# Patient Record
Sex: Female | Born: 1985 | Race: Black or African American | Hispanic: No | Marital: Married | Smoking: Never smoker
Health system: Southern US, Community
[De-identification: ages and names within clinical notes are randomized; demographics above are authoritative.]

## PROBLEM LIST (undated history)

## (undated) DIAGNOSIS — A159 Respiratory tuberculosis unspecified: Secondary | ICD-10-CM

## (undated) HISTORY — PX: ANKLE FRACTURE SURGERY: SHX122

## (undated) HISTORY — PX: HIP ARTHROPLASTY: SHX981

---

## 2022-01-17 ENCOUNTER — Emergency Department (HOSPITAL_COMMUNITY): Payer: Self-pay

## 2022-01-17 ENCOUNTER — Encounter (HOSPITAL_COMMUNITY): Payer: Self-pay

## 2022-01-17 ENCOUNTER — Inpatient Hospital Stay (HOSPITAL_COMMUNITY)
Admission: EM | Admit: 2022-01-17 | Discharge: 2022-01-21 | DRG: 871 | Disposition: A | Discharge status: Home / Self Care | Payer: Self-pay | Attending: Internal Medicine | Admitting: Internal Medicine

## 2022-01-17 ENCOUNTER — Inpatient Hospital Stay (HOSPITAL_COMMUNITY): Payer: Self-pay

## 2022-01-17 ENCOUNTER — Other Ambulatory Visit: Payer: Self-pay

## 2022-01-17 DIAGNOSIS — E66811 Obesity, class 1: Secondary | ICD-10-CM | POA: Diagnosis present

## 2022-01-17 DIAGNOSIS — A419 Sepsis, unspecified organism: Principal | ICD-10-CM | POA: Diagnosis present

## 2022-01-17 DIAGNOSIS — Z6833 Body mass index (BMI) 33.0-33.9, adult: Secondary | ICD-10-CM

## 2022-01-17 DIAGNOSIS — Z881 Allergy status to other antibiotic agents status: Secondary | ICD-10-CM

## 2022-01-17 DIAGNOSIS — J984 Other disorders of lung: Secondary | ICD-10-CM

## 2022-01-17 DIAGNOSIS — R739 Hyperglycemia, unspecified: Secondary | ICD-10-CM | POA: Diagnosis present

## 2022-01-17 DIAGNOSIS — J189 Pneumonia, unspecified organism: Secondary | ICD-10-CM | POA: Diagnosis present

## 2022-01-17 DIAGNOSIS — Z20822 Contact with and (suspected) exposure to covid-19: Secondary | ICD-10-CM | POA: Diagnosis present

## 2022-01-17 DIAGNOSIS — J470 Bronchiectasis with acute lower respiratory infection: Secondary | ICD-10-CM | POA: Diagnosis present

## 2022-01-17 DIAGNOSIS — D71 Functional disorders of polymorphonuclear neutrophils: Secondary | ICD-10-CM | POA: Diagnosis present

## 2022-01-17 DIAGNOSIS — Z8611 Personal history of tuberculosis: Secondary | ICD-10-CM

## 2022-01-17 DIAGNOSIS — J479 Bronchiectasis, uncomplicated: Secondary | ICD-10-CM

## 2022-01-17 DIAGNOSIS — R7989 Other specified abnormal findings of blood chemistry: Secondary | ICD-10-CM | POA: Diagnosis present

## 2022-01-17 DIAGNOSIS — E669 Obesity, unspecified: Secondary | ICD-10-CM | POA: Diagnosis present

## 2022-01-17 HISTORY — DX: Respiratory tuberculosis unspecified: A15.9

## 2022-01-17 HISTORY — DX: Obesity, unspecified: E66.9

## 2022-01-17 HISTORY — DX: Obesity, class 1: E66.811

## 2022-01-17 LAB — COMPREHENSIVE METABOLIC PANEL
ALT: 45 U/L — ABNORMAL HIGH (ref 0–44)
AST: 38 U/L (ref 15–41)
Albumin: 4.1 g/dL (ref 3.5–5.0)
Alkaline Phosphatase: 79 U/L (ref 38–126)
Anion gap: 11 (ref 5–15)
BUN: 11 mg/dL (ref 6–20)
CO2: 21 mmol/L — ABNORMAL LOW (ref 22–32)
Calcium: 8.9 mg/dL (ref 8.9–10.3)
Chloride: 106 mmol/L (ref 98–111)
Creatinine, Ser: 0.96 mg/dL (ref 0.44–1.00)
GFR, Estimated: >60 mL/min (ref >60)
Glucose, Bld: 146 mg/dL — ABNORMAL HIGH (ref 70–99)
Potassium: 4 mmol/L (ref 3.5–5.1)
Sodium: 138 mmol/L (ref 135–145)
Total Bilirubin: 1.3 mg/dL — ABNORMAL HIGH (ref 0.3–1.2)
Total Protein: 7.3 g/dL (ref 6.5–8.1)

## 2022-01-17 LAB — CBC WITH DIFFERENTIAL/PLATELET
Abs Immature Granulocytes: 0.05 10*3/uL (ref 0.00–0.07)
Basophils Absolute: 0 10*3/uL (ref 0.0–0.1)
Basophils Relative: 0 %
Eosinophils Absolute: 0 10*3/uL (ref 0.0–0.5)
Eosinophils Relative: 0 %
HCT: 40.2 % (ref 36.0–46.0)
Hemoglobin: 13.5 g/dL (ref 12.0–15.0)
Immature Granulocytes: 1 %
Lymphocytes Relative: 11 %
Lymphs Abs: 1.2 10*3/uL (ref 0.7–4.0)
MCH: 30.1 pg (ref 26.0–34.0)
MCHC: 33.6 g/dL (ref 30.0–36.0)
MCV: 89.7 fL (ref 80.0–100.0)
Monocytes Absolute: 0.3 10*3/uL (ref 0.1–1.0)
Monocytes Relative: 3 %
Neutro Abs: 9.3 10*3/uL — ABNORMAL HIGH (ref 1.7–7.7)
Neutrophils Relative %: 85 %
Platelets: 258 10*3/uL (ref 150–400)
RBC: 4.48 MIL/uL (ref 3.87–5.11)
RDW: 12.4 % (ref 11.5–15.5)
WBC: 10.9 10*3/uL — ABNORMAL HIGH (ref 4.0–10.5)
nRBC: 0 % (ref 0.0–0.2)

## 2022-01-17 LAB — URINALYSIS, ROUTINE W REFLEX MICROSCOPIC
Bilirubin Urine: NEGATIVE
Glucose, UA: NEGATIVE mg/dL
Ketones, ur: NEGATIVE mg/dL
Nitrite: NEGATIVE
Protein, ur: NEGATIVE mg/dL
Specific Gravity, Urine: 1.005 (ref 1.005–1.030)
pH: 6 (ref 5.0–8.0)

## 2022-01-17 LAB — MRSA NEXT GEN BY PCR, NASAL: MRSA by PCR Next Gen: NOT DETECTED

## 2022-01-17 LAB — RESPIRATORY PANEL BY PCR

## 2022-01-17 LAB — LACTIC ACID, PLASMA
Lactic Acid, Venous: 2.1 mmol/L (ref 0.5–1.9)
Lactic Acid, Venous: 1.1 mmol/L (ref 0.5–1.9)

## 2022-01-17 LAB — MONONUCLEOSIS SCREEN: Mono Screen: NEGATIVE

## 2022-01-17 LAB — STREP PNEUMONIAE URINARY ANTIGEN: Strep Pneumo Urinary Antigen: NEGATIVE

## 2022-01-17 LAB — HEMOGLOBIN A1C
Hgb A1c MFr Bld: 4.5 % — ABNORMAL LOW (ref 4.8–5.6)
Mean Plasma Glucose: 82.45 mg/dL

## 2022-01-17 LAB — PROTIME-INR
INR: 1.4 — ABNORMAL HIGH (ref 0.8–1.2)
Prothrombin Time: 17.3 seconds — ABNORMAL HIGH (ref 11.4–15.2)

## 2022-01-17 LAB — RESP PANEL BY RT-PCR (FLU A&B, COVID) ARPGX2
Influenza A by PCR: NEGATIVE
Influenza B by PCR: NEGATIVE
SARS Coronavirus 2 by RT PCR: NEGATIVE

## 2022-01-17 LAB — HIV ANTIBODY (ROUTINE TESTING W REFLEX): HIV Screen 4th Generation wRfx: NONREACTIVE

## 2022-01-17 LAB — I-STAT BETA HCG BLOOD, ED (MC, WL, AP ONLY): I-stat hCG, quantitative: <5 m[IU]/mL (ref <5)

## 2022-01-17 LAB — APTT: aPTT: 33 seconds (ref 24–36)

## 2022-01-17 MED ORDER — IPRATROPIUM-ALBUTEROL 0.5-2.5 (3) MG/3ML IN SOLN: 3.0000 mL | RESPIRATORY_TRACT | Status: DC | PRN

## 2022-01-17 MED ORDER — LACTATED RINGERS IV BOLUS (SEPSIS)
1000.0000 mL | Freq: Once | INTRAVENOUS | Status: AC
  Administered 2022-01-17: 1000 mL via INTRAVENOUS

## 2022-01-17 MED ORDER — ALBUTEROL SULFATE HFA 108 (90 BASE) MCG/ACT IN AERS: 2.0000 | INHALATION_SPRAY | RESPIRATORY_TRACT | Status: DC | PRN

## 2022-01-17 MED ORDER — PROCHLORPERAZINE EDISYLATE 10 MG/2ML IJ SOLN
10.0000 mg | Freq: Once | INTRAMUSCULAR | Status: AC
  Administered 2022-01-17: 10 mg via INTRAVENOUS
  Filled 2022-01-17: qty 2

## 2022-01-17 MED ORDER — VANCOMYCIN HCL IN DEXTROSE 1-5 GM/200ML-% IV SOLN
1000.0000 mg | Freq: Once | INTRAVENOUS | Status: AC
  Administered 2022-01-17: 1000 mg via INTRAVENOUS
  Filled 2022-01-17: qty 200

## 2022-01-17 MED ORDER — LACTATED RINGERS IV SOLN: INTRAVENOUS | Status: AC

## 2022-01-17 MED ORDER — SODIUM CHLORIDE 0.9 % IV SOLN
1.0000 g | Freq: Three times a day (TID) | INTRAVENOUS | Status: DC
  Administered 2022-01-17: 1 g via INTRAVENOUS
  Filled 2022-01-17 (×2): qty 5

## 2022-01-17 MED ORDER — PROCHLORPERAZINE EDISYLATE 10 MG/2ML IJ SOLN
5.0000 mg | Freq: Once | INTRAMUSCULAR | Status: AC
  Administered 2022-01-17: 5 mg via INTRAVENOUS
  Filled 2022-01-17: qty 2

## 2022-01-17 MED ORDER — LACTATED RINGERS IV BOLUS (SEPSIS)
500.0000 mL | Freq: Once | INTRAVENOUS | Status: AC
  Administered 2022-01-17: 500 mL via INTRAVENOUS

## 2022-01-17 MED ORDER — KETOROLAC TROMETHAMINE 30 MG/ML IJ SOLN
30.0000 mg | Freq: Once | INTRAMUSCULAR | Status: AC
  Administered 2022-01-17: 30 mg via INTRAVENOUS
  Filled 2022-01-17: qty 1

## 2022-01-17 MED ORDER — ALBUTEROL SULFATE (2.5 MG/3ML) 0.083% IN NEBU
2.5000 mg | INHALATION_SOLUTION | RESPIRATORY_TRACT | Status: DC | PRN
Start: 2022-01-17 — End: 2022-01-21

## 2022-01-17 MED ORDER — METRONIDAZOLE 500 MG/100ML IV SOLN
500.0000 mg | Freq: Once | INTRAVENOUS | Status: AC
Start: 2022-01-17 — End: 2022-01-17
  Administered 2022-01-17: 500 mg via INTRAVENOUS
  Filled 2022-01-17: qty 100

## 2022-01-17 MED ORDER — PANTOPRAZOLE SODIUM 40 MG IV SOLR
40.0000 mg | Freq: Once | INTRAVENOUS | Status: AC
  Administered 2022-01-17: 40 mg via INTRAVENOUS
  Filled 2022-01-17: qty 10

## 2022-01-17 MED ORDER — CHLORHEXIDINE GLUCONATE CLOTH 2 % EX PADS
6.0000 | MEDICATED_PAD | Freq: Every day | CUTANEOUS | Status: DC
Start: 2022-01-18 — End: 2022-01-20
  Administered 2022-01-18 – 2022-01-20 (×3): 6 via TOPICAL

## 2022-01-17 MED ORDER — SODIUM CHLORIDE 0.9 % IV SOLN
2.0000 g | Freq: Once | INTRAVENOUS | Status: DC
  Filled 2022-01-17: qty 12.5

## 2022-01-17 MED ORDER — ACETAMINOPHEN 325 MG PO TABS
650.0000 mg | ORAL_TABLET | Freq: Four times a day (QID) | ORAL | Status: DC | PRN
  Administered 2022-01-17 – 2022-01-21 (×6): 650 mg via ORAL
  Filled 2022-01-17 (×6): qty 2

## 2022-01-17 MED ORDER — LEVOFLOXACIN IN D5W 750 MG/150ML IV SOLN
750.0000 mg | INTRAVENOUS | Status: AC
  Administered 2022-01-17 – 2022-01-21 (×5): 750 mg via INTRAVENOUS
  Filled 2022-01-17 (×5): qty 150

## 2022-01-17 MED ORDER — ACETAMINOPHEN 325 MG PO TABS
650.0000 mg | ORAL_TABLET | Freq: Once | ORAL | Status: AC
  Administered 2022-01-17: 650 mg via ORAL
  Filled 2022-01-17: qty 2

## 2022-01-17 MED ORDER — IBUPROFEN 800 MG PO TABS
800.0000 mg | ORAL_TABLET | Freq: Once | ORAL | Status: AC
  Administered 2022-01-17: 800 mg via ORAL
  Filled 2022-01-17: qty 1

## 2022-01-17 MED ORDER — ACETAMINOPHEN 650 MG RE SUPP: 650.0000 mg | Freq: Four times a day (QID) | RECTAL | Status: DC | PRN

## 2022-01-17 MED ORDER — ORAL CARE MOUTH RINSE
15.0000 mL | Freq: Two times a day (BID) | OROMUCOSAL | Status: DC
  Administered 2022-01-17 – 2022-01-20 (×6): 15 mL via OROMUCOSAL

## 2022-01-17 MED ORDER — METRONIDAZOLE 500 MG/100ML IV SOLN
500.0000 mg | Freq: Two times a day (BID) | INTRAVENOUS | Status: DC
  Administered 2022-01-17 – 2022-01-19 (×4): 500 mg via INTRAVENOUS
  Filled 2022-01-17 (×5): qty 100

## 2022-01-17 MED ORDER — ONDANSETRON HCL 4 MG/2ML IJ SOLN
4.0000 mg | Freq: Four times a day (QID) | INTRAMUSCULAR | Status: DC | PRN
  Administered 2022-01-17 (×2): 4 mg via INTRAVENOUS
  Filled 2022-01-17 (×3): qty 2

## 2022-01-17 NOTE — Sepsis Progress Note (Signed)
Following per sepsis protocol   

## 2022-01-17 NOTE — ED Notes (Signed)
Patient transported to CT 

## 2022-01-17 NOTE — ED Notes (Addendum)
Paged Hospitalist@06 :25am.

## 2022-01-17 NOTE — ED Notes (Signed)
US at bedside

## 2022-01-17 NOTE — Progress Notes (Signed)
A consult was received from an ED physician for vancomycin and cefepime per pharmacy dosing.  The patient's profile has been reviewed for ht/wt/allergies/indication/available labs.   A one time order has been placed for vanocmycin 1gm and cefepime 2gm.    Further antibiotics/pharmacy consults should be ordered by admitting physician if indicated.                       Thank you, Arley Phenix RPh 01/17/2022, 5:32 AM

## 2022-01-17 NOTE — ED Notes (Signed)
Patient's husband, Melanee Spry [phone]

## 2022-01-17 NOTE — H&P (Addendum)
History and Physical    Patient: Misty Heath GYI:[id number] DOB: Mar 08, 1986 DOA: 01/17/2022 DOS: the patient was seen and examined on 01/17/2022 PCP: Pcp, No  Patient coming from: Home  Chief Complaint:  Chief Complaint  Patient presents with   Fever   Headache   Generalized Body Aches   HPI: Misty Heath is a 36 y.o. female with medical history significant of class I obesity who is coming to the emergency department with complaints of 3 days of fever, frontal headache, generalized body aches, fatigue and generalized weakness for the past 3 days.  No sick contacts or travel history.  She has also had chills night sweats.  No sore throat, rhinorrhea, dyspnea, wheezing or hemoptysis.  No chest pain, palpitations, diaphoresis, PND, orthopnea or pitting edema of the lower extremities.  No appetite changes, abdominal pain, diarrhea, constipation, melena or hematochezia.  No flank pain, dysuria, frequency or hematuria.  No polyuria, polydipsia, polyphagia or blurred vision.   ED course: Initial vital signs were temperature 106.2, pulse 177, respirations 22, BP 111/77 mmHg and O2 sat 91% on room air.  The patient received 2500 mL of LR bolus, ondansetron 4 mg IVP, vancomycin, aztreonam and IV metronidazole.  Lab work: Her CBC is her white count 10.9 with 85% neutrophils, hemoglobin 13.5 g/dL and platelets 350.  PT was 17.3, INR 1.4 and PTT 33. Urinalysis with moderate hemoglobinuria, trace leukocyte esterase and many bacteria.  Coronavirus 2 and influenza PCR was negative.  CMP showed a CO2 of 21 mmol/L, glucose of 146 and total bilirubin 1.3 mg/dL.  ALT was 45 units/L.  Lactic acid was 2.1 and then 1.1 mmol/L.  Imaging: 2 view chest radiograph with left greater than right upper lobe widespread pulmonary nodularity, superimposed medial left upper lobe consolidation.  No pleural effusion.  Follow-up two-view chest radiograph recommended in 3 to 4 weeks after antibiotic therapy.  Associated left lung  volume loss with some leftward shift of the trachea and mediastinum, mediastinal contour seems to remain normal.  Review of Systems: As mentioned in the history of present illness. All other systems reviewed and are negative.  History reviewed. No pertinent past medical history. History reviewed. No pertinent surgical history. Social History:  reports that she has never smoked. She has never used smokeless tobacco. She reports that she does not drink alcohol and does not use drugs.  Allergies  Allergen Reactions   Ceclor [Cefaclor] Anaphylaxis and Shortness Of Breath    History reviewed.  To the best of her knowledge, there is no medical family history.  Prior to Admission medications   Not on File    Physical Exam: Vitals:   01/17/22 0700 01/17/22 0930 01/17/22 1000 01/17/22 1035  BP:  135/76 122/75   Pulse:  95 85   Resp:  16 19   Temp: (!) 100.8 F (38.2 C)   98.3 F (36.8 C)  TempSrc: Oral   Oral  SpO2:  98% 98%   Weight:      Height:       Physical Exam Vitals and nursing note reviewed.  Constitutional:      Appearance: She is obese. She is ill-appearing.  HENT:     Head: Normocephalic.     Mouth/Throat:     Mouth: Mucous membranes are moist.  Eyes:     General: No scleral icterus.    Pupils: Pupils are equal, round, and reactive to light.  Neck:     Vascular: No JVD.  Cardiovascular:  Rate and Rhythm: Normal rate and regular rhythm.     Heart sounds: Normal heart sounds, S1 normal and S2 normal.  Pulmonary:     Effort: Pulmonary effort is normal. No accessory muscle usage or respiratory distress.     Breath sounds: Examination of the right-upper field reveals rales. Examination of the left-upper field reveals rales. Rales present. No wheezing or rhonchi.  Abdominal:     General: Bowel sounds are normal. There is no distension.     Palpations: Abdomen is soft.     Tenderness: There is no abdominal tenderness.  Musculoskeletal:     Cervical back: Neck  supple.     Right lower leg: No edema.     Left lower leg: No edema.  Skin:    General: Skin is warm and dry.  Neurological:     General: No focal deficit present.     Mental Status: She is alert and oriented to person, place, and time.  Psychiatric:        Mood and Affect: Mood normal.        Behavior: Behavior normal.    Data Reviewed:  Results are pending, will review when available.  Principal Problem:   CAP (community acquired pneumonia) Meeting criteria for: Sepsis due to undetermined organism POA [facility]) With imaging revealing:   Bronchiectasis (HCC)   Pulmonary calcification determined by imaging Admit to stepdown/inpatient. Supplemental oxygen as needed. As needed bronchodilators. Begin Levaquin 750 mg IVPB daily. Continue metronidazole every 12 hours. Check strep pneumoniae urinary antigen. Check sputum Gram stain, culture and sensitivity. Follow-up blood culture and sensitivity. Follow-up CBC and chemistry in the morning. Pulmonary consult appreciated.  Active Problems:   Class 1 obesity BMI of 34.11 kg/m. Lifestyle modifications. Needs to establish with PCP.    Hyperglycemia Hemoglobin A1c was 4.5%. Lifestyle modifications advised.    Abnormal LFTs RUQ ultrasound was normal.     Advance Care Planning:   Code Status: Full Code    Consults:   Family Communication:   Severity of Illness: The appropriate patient status for this patient is INPATIENT. Inpatient status is judged to be reasonable and necessary in order to provide the required intensity of service to ensure the patient's safety. The patient's presenting symptoms, physical exam findings, and initial radiographic and laboratory data in the context of their chronic comorbidities is felt to place them at high risk for further clinical deterioration. Furthermore, it is not anticipated that the patient will be medically stable for discharge from the hospital within 2 midnights of admission.   *  I certify that at the point of admission it is my clinical judgment that the patient will require inpatient hospital care spanning beyond 2 midnights from the point of admission due to high intensity of service, high risk for further deterioration and high frequency of surveillance required.*  Author: Bobette Mo, MD 01/17/2022 10:43 AM  For on call review [email or web address].   This document was prepared using Dragon voice recognition system and may contain some unintended transcription errors.

## 2022-01-17 NOTE — ED Notes (Signed)
Pulmonology at bedside.

## 2022-01-17 NOTE — ED Triage Notes (Signed)
Pt reports with fever, headache, and generalized body aches x 3 days.

## 2022-01-17 NOTE — Progress Notes (Signed)
  Carryover admission to the Day Admitter.  I discussed this case with the EDP, Dr.Palumbo.  Per these discussions:   This is a 36 year old female who is being admitted for sepsis due to suspected community-acquired pneumonia presenting with 2 to 3 days of subjective fever, with reported temperature max of 10 5-1 06 at home over that timeframe.  Not associate with any headache, neck stiffness, rash, abdominal pain, nausea, vomiting, diarrhea, dysuria.  Objective fever noted in the ED this evening, and the patient has received both Tylenol and ibuprofen for this.  Initially tachycardic, with heart rates in the 160s to 170s, reportedly sinus, improving with IV fluids and initiation of broad-spectrum IV antibiotics, with most recent heart rates noted to be in the 110s.  Blood pressure most recently noted to be 124/76 following 2700 LR bolus.  O2 sats 97% on room air.  Chest x-ray, while reportedly suboptimal quality, reportedly appeared to demonstrate evidence of right upper lobe infiltrate concerning for pneumonia.  Two-view chest x-ray has been ordered to further evaluate, with result currently pending.  COVID-19/influenza PCR negative.  Blood cultures x2 were collected prior to initiation of IV vancomycin and aztreonam, prompted by reported allergies to cephalosporin class.  Stat lactate has been ordered, with result currently pending.  I have placed an order for inpatient admission for further evaluation management of sepsis due to suspected community-acquired pneumonia  I have placed some additional preliminary admit orders via the adult multi-morbid admission order set.  We will need orders for additional IV antibiotics.  Follow-up result of lactate.  Additionally, please follow-up on result of two-view chest x-ray, as above.   Newton Pigg, DO Hospitalist

## 2022-01-17 NOTE — Consult Note (Signed)
NAME:  Misty Heath, MRN:  [id number], DOB:  09-27-1985, LOS: 0 ADMISSION DATE:  01/17/2022, CONSULTATION DATE:  6/1 REFERRING MD:  Robb Matar, CHIEF COMPLAINT:  abnormal cxr    History of Present Illness:  36 year old female who presented to ER 6/1 w/ cc: fever, HA and generalized body ache w/ fatigue and weakness on-going for approx 3d.  In ER temp 106.2 HR 170s.  RA sats 91%, RVP negative for influenza and Covid.  CXR showed LUL airspace disease and CT imaging was recommended and obtained. On CT imaging showed LUL bronchiectasis w/ multiple calcified pulmonary nodules. Because of this imaging PCCM asked to evaluate.  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  Has chronic cough  Significant Hospital Events: Including procedures, antibiotic start and stop dates in addition to other pertinent events   6/1 admitted w/ fever, chills, HA, malaise, working dx PNA and sepsis. Lactate 2.1, RT-PCR neg, U antigens checked, IV fluid bolus administered. Started on IV flagyl and levaquin. PCCM asked to evaluate due to left upper lobe btx and cavitary lung nodules.   Interim History / Subjective:  Feels a little better.   Objective   Blood pressure 139/71, pulse 90, temperature 98.3 F (36.8 C), temperature source Oral, resp. rate 20, height 5\' 4"  (1.626 m), weight 90.1 kg, SpO2 96 %.        Intake/Output Summary (Last 24 hours) at 01/17/2022 1306 Last data filed at 01/17/2022 0732 Gross per 24 hour  Intake 2709.98 ml  Output no documentation  Net 2709.98 ml   Filed Weights   01/17/22 0516 01/17/22 0539  Weight: 78 kg 90.1 kg    Examination: General: 36 yof resting in bed NAD  HENT: NCAT MMM Lungs: clear  Cardiovascular: RRR w/out MRG Abdomen: soft  Extremities: warm and dry  Neuro: awake and oriented  GU: voids  Resolved Hospital Problem list     Assessment & Plan:   Fever and myalgia ? 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  Will decide on FOB Send full RVP Fungal studies if able Cont CAP coverage.   See attending note above   03/19/22 ACNP-BC [facility] Pulmonary/Critical Care Pager # [phone] OR # [phone] if no answer      Best Practice (right click and "Reselect all SmartList Selections" daily)  Per primary   Labs   CBC: Recent Labs  Lab 01/17/22 0524  WBC 10.9*  NEUTROABS 9.3*  HGB 13.5  HCT 40.2  MCV 89.7  PLT 258    Basic Metabolic Panel: Recent Labs  Lab 01/17/22 0524  NA 138  K 4.0  CL 106  CO2 21*  GLUCOSE 146*  BUN 11  CREATININE 0.96  CALCIUM 8.9   GFR: Estimated Creatinine Clearance: 89 mL/min (by C-G formula based on SCr of 0.96 mg/dL). Recent Labs  Lab 01/17/22 0524 01/17/22 0728  WBC 10.9*  --   LATICACIDVEN 2.1* 1.1    Liver Function Tests: Recent Labs  Lab 01/17/22 0524  AST 38  ALT 45*  ALKPHOS 79  BILITOT 1.3*  PROT 7.3  ALBUMIN 4.1   No results for input(s): LIPASE, AMYLASE in the last 168 hours. No results for input(s): AMMONIA in the last 168 hours.  ABG No results found for: PHART, PCO2ART,  PO2ART, HCO3, TCO2, ACIDBASEDEF, O2SAT   Coagulation Profile: Recent Labs  Lab 01/17/22 0524  INR 1.4*    Cardiac Enzymes: No results for input(s): CKTOTAL, CKMB, CKMBINDEX, TROPONINI in the last 168 hours.  HbA1C: Hgb A1c MFr Bld  Date/Time Value Ref Range Status  01/17/2022 05:25 AM 4.5 (L) 4.8 - 5.6 % Final    Comment:    (NOTE) Pre diabetes:          5.7%-6.4%  Diabetes:              >6.4%  Glycemic control for   <7.0% adults with diabetes     CBG: No results for input(s): GLUCAP in the last 168 hours.  Review of Systems:   Review of Systems  Constitutional:  Positive for fever and malaise/fatigue.  Eyes: Negative.   Respiratory:  Positive for cough.   Cardiovascular: Negative.    Gastrointestinal: Negative.   Genitourinary: Negative.   Musculoskeletal:  Positive for myalgias.  Skin: Negative.   Endo/Heme/Allergies: Negative.   Psychiatric/Behavioral: Negative.     Past Medical History:  She,  has a past medical history of Class 1 obesity (01/17/2022).   Surgical History:   Past Surgical History:  Procedure Laterality Date   ANKLE FRACTURE SURGERY Right    HIP ARTHROPLASTY Right      Social History:   reports that she has never smoked. She has never used smokeless tobacco. She reports that she does not drink alcohol and does not use drugs.   Family History:  Her family history is not on file.   Allergies Allergies  Allergen Reactions   Ceclor [Cefaclor] Anaphylaxis and Shortness Of Breath     Home Medications  Prior to Admission medications   Not on File     Critical care time: NA   Simonne Martinet ACNP-BC [facility] Pulmonary/Critical Care Pager # [phone] OR # [phone] if no answer

## 2022-01-17 NOTE — ED Provider Notes (Signed)
[facility]-EMERGENCY DEPT Provider Note   CSN: [id number] Arrival date & time: 01/17/22  0510     History  Chief Complaint  Patient presents with   Fever   Headache   Generalized Body Aches    Misty Heath is a 36 y.o. female.  The history is provided by the patient and the spouse. The history is limited by the condition of the patient.  Fever Max temp prior to arrival:  101.1 Temp source:  Oral Severity:  Moderate Onset quality:  Gradual Duration:  3 days Timing:  Constant Progression:  Waxing and waning Chronicity:  New Relieved by:  Nothing Worsened by:  Nothing Ineffective treatments:  Acetaminophen and ibuprofen Associated symptoms: chills   Associated symptoms: no cough, no diarrhea, no dysuria, no somnolence and no vomiting   Risk factors comment:  Started after going to the gym No tick exposure.  No rashes on the skin.      Home Medications Prior to Admission medications   Not on File      Allergies    Ceclor [cefaclor]    Review of Systems   Review of Systems  Unable to perform ROS: Acuity of condition  Constitutional:  Positive for chills and fever.  HENT:  Negative for facial swelling.   Eyes:  Negative for redness.  Respiratory:  Negative for cough.   Gastrointestinal:  Negative for diarrhea and vomiting.  Genitourinary:  Negative for dysuria.   Physical Exam Updated Vital Signs BP 111/77 (BP Location: Left Arm)   Pulse (!) 177   Temp (!) 105.6 F (40.9 C) (Rectal)   Resp (!) 22   Ht 5\' 4"  (1.626 m)   Wt 90.1 kg   LMP  (LMP Unknown)   SpO2 91%   BMI 34.11 kg/m  Physical Exam Vitals and nursing note reviewed.  Constitutional:      Appearance: She is well-developed. She is not diaphoretic.  HENT:     Head: Normocephalic and atraumatic.     Nose: Nose normal.     Mouth/Throat:     Mouth: Mucous membranes are moist.     Pharynx: Oropharynx is clear.  Eyes:     Conjunctiva/sclera: Conjunctivae normal.      Pupils: Pupils are equal, round, and reactive to light.     Comments: Normal appearance  Cardiovascular:     Rate and Rhythm: Regular rhythm. Tachycardia present.     Pulses: Normal pulses.     Heart sounds: Normal heart sounds.  Pulmonary:     Effort: Pulmonary effort is normal. No respiratory distress.     Breath sounds: Normal breath sounds.  Abdominal:     General: Bowel sounds are normal. There is no distension.     Palpations: Abdomen is soft. There is no mass.     Tenderness: There is no abdominal tenderness. There is no guarding or rebound.  Genitourinary:    Comments: No CVA tenderness Musculoskeletal:        General: Normal range of motion.     Cervical back: Normal range of motion and neck supple. No rigidity or tenderness.  Lymphadenopathy:     Cervical: No cervical adenopathy.  Skin:    General: Skin is warm and dry.     Capillary Refill: Capillary refill takes less than 2 seconds.     Findings: No rash.  Neurological:     General: No focal deficit present.     Mental Status: She is alert and oriented to person, place,  and time.  Psychiatric:        Mood and Affect: Mood normal.        Behavior: Behavior normal.    ED Results / Procedures / Treatments   Labs (all labs ordered are listed, but only abnormal results are displayed) Results for orders placed or performed during the hospital encounter of 01/17/22  Comprehensive metabolic panel  Result Value Ref Range   Sodium 138 135 - 145 mmol/L   Potassium 4.0 3.5 - 5.1 mmol/L   Chloride 106 98 - 111 mmol/L   CO2 21 (L) 22 - 32 mmol/L   Glucose, Bld 146 (H) 70 - 99 mg/dL   BUN 11 6 - 20 mg/dL   Creatinine, Ser 4.090.96 0.44 - 1.00 mg/dL   Calcium 8.9 8.9 - 81.110.3 mg/dL   Total Protein 7.3 6.5 - 8.1 g/dL   Albumin 4.1 3.5 - 5.0 g/dL   AST 38 15 - 41 U/L   ALT 45 (H) 0 - 44 U/L   Alkaline Phosphatase 79 38 - 126 U/L   Total Bilirubin 1.3 (H) 0.3 - 1.2 mg/dL   GFR, Estimated >91>60 >47>60 mL/min   Anion gap 11 5 - 15   Protime-INR  Result Value Ref Range   Prothrombin Time 17.3 (H) 11.4 - 15.2 seconds   INR 1.4 (H) 0.8 - 1.2  APTT  Result Value Ref Range   aPTT 33 24 - 36 seconds  CBC with Differential  Result Value Ref Range   WBC 10.9 (H) 4.0 - 10.5 K/uL   RBC 4.48 3.87 - 5.11 MIL/uL   Hemoglobin 13.5 12.0 - 15.0 g/dL   HCT 82.940.2 56.236.0 - 13.046.0 %   MCV 89.7 80.0 - 100.0 fL   MCH 30.1 26.0 - 34.0 pg   MCHC 33.6 30.0 - 36.0 g/dL   RDW 86.512.4 78.411.5 - 69.615.5 %   Platelets 258 150 - 400 K/uL   nRBC 0.0 0.0 - 0.2 %   Neutrophils Relative % 85 %   Neutro Abs 9.3 (H) 1.7 - 7.7 K/uL   Lymphocytes Relative 11 %   Lymphs Abs 1.2 0.7 - 4.0 K/uL   Monocytes Relative 3 %   Monocytes Absolute 0.3 0.1 - 1.0 K/uL   Eosinophils Relative 0 %   Eosinophils Absolute 0.0 0.0 - 0.5 K/uL   Basophils Relative 0 %   Basophils Absolute 0.0 0.0 - 0.1 K/uL   Immature Granulocytes 1 %   Abs Immature Granulocytes 0.05 0.00 - 0.07 K/uL  I-Stat beta hCG blood, ED  Result Value Ref Range   I-stat hCG, quantitative <5.0 <5 mIU/mL   Comment 3           No results found.   EKG EKG Interpretation  Date/Time:  Thursday January 17 2022 05:28:36 EDT Ventricular Rate:  174 PR Interval:  101 QRS Duration: 174 QT Interval:  326 QTC Calculation: 555 R Axis:   252 Text Interpretation: Right and left arm electrode reversal, interpretation assumes no reversal Sinus tachycardia Confirmed by Nicanor AlconPalumbo, Pricilla Moehle ([phone]) on 01/17/2022 5:40:47 AM  Radiology No results found.  Procedures Procedures    Medications Ordered in ED Medications  lactated ringers infusion (has no administration in time range)  lactated ringers bolus 1,000 mL (1,000 mLs Intravenous New Bag/Given 01/17/22 0537)    And  lactated ringers bolus 1,000 mL (1,000 mLs Intravenous New Bag/Given 01/17/22 0538)    And  lactated ringers bolus 500 mL (has no administration in time range)  metroNIDAZOLE (  FLAGYL) IVPB 500 mg (has no administration in time range)  vancomycin  (VANCOCIN) IVPB 1000 mg/200 mL premix (has no administration in time range)  aztreonam (AZACTAM) 1 g in sodium chloride 0.9 % 100 mL IVPB (has no administration in time range)  acetaminophen (TYLENOL) tablet 650 mg (650 mg Oral Given 01/17/22 0542)  ibuprofen (ADVIL) tablet 800 mg (800 mg Oral Given 01/17/22 0542)     ED Course/ Medical Decision Making/ A&P                           Medical Decision Making Fever following going to the gym the other day.  Body aches and chills.    Amount and/or Complexity of Data Reviewed Independent Historian: spouse    Details: see above External Data Reviewed: notes.    Details: previous notes reivewed. Labs: ordered.    Details: all labs reviewed: normal sodium 138, normal potassium 4.0, CO2 low 21, BUN normal creatinine .96.  slight elevation of bilirubin 1.3 slight elevation of ALT.  white count elevated 10.9 and normal hemoglobin 13.5.  Elevated PT and INR. Radiology: ordered and independent interpretation performed.    Details: NO CHF by me on CXR ECG/medicine tests: ordered and independent interpretation performed. Decision-making details documented in ED Course. Discussion of management or test interpretation with external provider(s): Case d/w Dr. Arlean Hopping of hospitalist   Risk OTC drugs. Prescription drug management. Decision regarding hospitalization. Risk Details: Sepsis of unknown etiology.  Tachycardia out of proportion to fever.    Critical Care Total time providing critical care: 60 minutes (Sepsis bundle with multiple boluses and multiple antibiotics given.  )   Final Clinical Impression(s) / ED Diagnoses Final diagnoses:  Sepsis, due to unspecified organism, unspecified whether acute organ dysfunction present [facility])   The patient appears reasonably stabilized for admission considering the current resources, flow, and capabilities available in the ED at this time, and I doubt any other [facility] requiring further screening and/or  treatment in the ED prior to admission.  Rx / DC Orders ED Discharge Orders     None         Zaydon Kinser, MD 01/17/22 3710

## 2022-01-18 ENCOUNTER — Encounter (HOSPITAL_COMMUNITY)
Admission: EM | Disposition: A | Discharge status: Home / Self Care | Payer: Self-pay | Source: Home / Self Care | Attending: Internal Medicine

## 2022-01-18 ENCOUNTER — Telehealth: Payer: Self-pay | Admitting: Student

## 2022-01-18 ENCOUNTER — Encounter (HOSPITAL_COMMUNITY): Payer: Self-pay

## 2022-01-18 HISTORY — PX: VIDEO BRONCHOSCOPY: SHX5072

## 2022-01-18 HISTORY — PX: BRONCHIAL WASHINGS: SHX5105

## 2022-01-18 LAB — BODY FLUID CELL COUNT WITH DIFFERENTIAL
Eos, Fluid: 1 %
Lymphs, Fluid: 20 %
Monocyte-Macrophage-Serous Fluid: 13 % — ABNORMAL LOW (ref 50–90)
Neutrophil Count, Fluid: 66 % — ABNORMAL HIGH (ref 0–25)
Total Nucleated Cell Count, Fluid: 19 cu mm (ref 0–1000)

## 2022-01-18 LAB — CBC
HCT: 36.9 % (ref 36.0–46.0)
Hemoglobin: 12.3 g/dL (ref 12.0–15.0)
MCH: 29.5 pg (ref 26.0–34.0)
MCHC: 33.3 g/dL (ref 30.0–36.0)
MCV: 88.5 fL (ref 80.0–100.0)
Platelets: 220 10*3/uL (ref 150–400)
RBC: 4.17 MIL/uL (ref 3.87–5.11)
RDW: 12.6 % (ref 11.5–15.5)
WBC: 9.2 10*3/uL (ref 4.0–10.5)
nRBC: 0 % (ref 0.0–0.2)

## 2022-01-18 LAB — BASIC METABOLIC PANEL
Anion gap: 7 (ref 5–15)
BUN: 6 mg/dL (ref 6–20)
CO2: 22 mmol/L (ref 22–32)
Calcium: 8.8 mg/dL — ABNORMAL LOW (ref 8.9–10.3)
Chloride: 111 mmol/L (ref 98–111)
Creatinine, Ser: 0.65 mg/dL (ref 0.44–1.00)
GFR, Estimated: >60 mL/min (ref >60)
Glucose, Bld: 98 mg/dL (ref 70–99)
Potassium: 3.7 mmol/L (ref 3.5–5.1)
Sodium: 140 mmol/L (ref 135–145)

## 2022-01-18 SURGERY — VIDEO BRONCHOSCOPY WITHOUT FLUORO
Anesthesia: Moderate Sedation

## 2022-01-18 MED ORDER — PHENYLEPHRINE HCL 0.25 % NA SOLN
NASAL | Status: AC
  Filled 2022-01-18: qty 15

## 2022-01-18 MED ORDER — IBUPROFEN 200 MG PO TABS
400.0000 mg | ORAL_TABLET | Freq: Once | ORAL | Status: AC
  Administered 2022-01-18: 400 mg via ORAL
  Filled 2022-01-18: qty 2

## 2022-01-18 MED ORDER — LIDOCAINE HCL URETHRAL/MUCOSAL 2 % EX GEL
1.0000 "application " | Freq: Once | CUTANEOUS | Status: AC
  Filled 2022-01-18: qty 5

## 2022-01-18 MED ORDER — FENTANYL CITRATE (PF) 100 MCG/2ML IJ SOLN
INTRAMUSCULAR | Status: AC
  Filled 2022-01-18: qty 4

## 2022-01-18 MED ORDER — LIDOCAINE HCL URETHRAL/MUCOSAL 2 % EX GEL
CUTANEOUS | Status: AC
  Filled 2022-01-18: qty 30

## 2022-01-18 MED ORDER — MIDAZOLAM HCL 2 MG/2ML IJ SOLN
INTRAMUSCULAR | Status: AC
  Filled 2022-01-18: qty 8

## 2022-01-18 MED ORDER — FENTANYL CITRATE (PF) 100 MCG/2ML IJ SOLN: 50.0000 ug | INTRAMUSCULAR | Status: DC | PRN

## 2022-01-18 MED ORDER — SODIUM CHLORIDE 0.9 % IV SOLN: INTRAVENOUS | Status: DC | PRN

## 2022-01-18 MED ORDER — FENTANYL CITRATE (PF) 100 MCG/2ML IJ SOLN
INTRAMUSCULAR | Status: AC | PRN
  Administered 2022-01-18: 100 ug via INTRAVENOUS
  Administered 2022-01-18: 50 ug via INTRAVENOUS

## 2022-01-18 MED ORDER — LIDOCAINE HCL URETHRAL/MUCOSAL 2 % EX GEL
CUTANEOUS | Status: DC | PRN
  Administered 2022-01-18: 1

## 2022-01-18 MED ORDER — PHENYLEPHRINE 80 MCG/ML (10ML) SYRINGE FOR IV PUSH (FOR BLOOD PRESSURE SUPPORT)
PREFILLED_SYRINGE | INTRAVENOUS | Status: AC
  Filled 2022-01-18: qty 10

## 2022-01-18 MED ORDER — MIDAZOLAM HCL 2 MG/2ML IJ SOLN
INTRAMUSCULAR | Status: AC | PRN
  Administered 2022-01-18 (×4): 1 mg via INTRAVENOUS

## 2022-01-18 MED ORDER — LIDOCAINE HCL (PF) 4 % IJ SOLN
INTRAMUSCULAR | Status: AC
  Filled 2022-01-18: qty 5

## 2022-01-18 MED ORDER — MIDAZOLAM HCL 2 MG/2ML IJ SOLN: 2.0000 mg | INTRAMUSCULAR | Status: DC | PRN

## 2022-01-18 MED ORDER — LIDOCAINE HCL 2 % IJ SOLN
20.0000 mL | Freq: Once | INTRAMUSCULAR | Status: AC
Start: 2022-01-18 — End: 2022-01-18

## 2022-01-18 MED ORDER — LIDOCAINE HCL (PF) 4 % IJ SOLN
INTRAMUSCULAR | Status: DC | PRN
  Administered 2022-01-18: 4 mL via RESPIRATORY_TRACT

## 2022-01-18 MED ORDER — LIDOCAINE HCL 1 % IJ SOLN
INTRAMUSCULAR | Status: DC | PRN
  Administered 2022-01-18: 10 mL

## 2022-01-18 MED ORDER — MIDAZOLAM HCL (PF) 5 MG/ML IJ SOLN
INTRAMUSCULAR | Status: AC
  Filled 2022-01-18: qty 2

## 2022-01-18 MED ORDER — LIDOCAINE HCL 1 % IJ SOLN
INTRAMUSCULAR | Status: AC
  Filled 2022-01-18: qty 20

## 2022-01-18 MED ORDER — OXYMETAZOLINE HCL 0.05 % NA SOLN
NASAL | Status: DC | PRN
  Administered 2022-01-18: 2

## 2022-01-18 NOTE — Progress Notes (Signed)
[facility] PROGRESS NOTE    Progress Note  Misty Heath  YSA:[id number] DOB: 1985/08/23 DOA: 01/17/2022 PCP: Pcp, No     Brief Narrative:   Misty Heath is an 36 y.o. female past medical history of class I obesity comes into the emergency room for 3 days of fever headache generalized body aches was found to have a fever 102 pulse of 177 respiration of 22 blood pressure 117/70 satting satting 91% on room air she was fluid resuscitated in the ED, white count of 11 chest radiograph showed a left greater than right right upper lobe wedge and a left upper lobe pneumonia pulmonary nodularity    Assessment/Plan:   Multifocal pneumonia bronchiectasis calcified nodules: She is now being weaned to room air she continues to spike fevers, 103. Was started on IV Levaquin and Flagyl. Strep pneumo pending. Mono is negative respiratory panel is negative. HIV is nonreactive Airborne isolation. Sputum culture and blood cultures are negative till date. CT of the chest was done that showed multiple pulmonary nodules of different size with central calcification with a splenomegaly concern about granulomatous process, question sarcoid Pulmonary was consulted recommended isolation and bronchoscopy on 01/18/2022. N.p.o.   Class I obesity: Currently n.p.o.  Hyperglycemia: With an A1c of 4.5 lifestyle and diet modifications.  Mildly elevated LFTs: Alkaline phos and bilirubin within normal follow-up with PCP as an outpatient question fatty liver   DVT prophylaxis: lovenox Family Communication:Husband Status is: Inpatient Remains inpatient appropriate because: Multifocal pneumonia with newly granulomatous disease    Code Status:     Code Status Orders  (From admission, onward)           Start     Ordered   01/17/22 0636  Full code  Continuous        01/17/22 0636           Code Status History     This patient has a current code status but no historical code status.       Advance Directive Documentation    Flowsheet Row Most Recent Value  Type of Advance Directive Healthcare Power of Attorney  Pre-existing out of facility DNR order (yellow form or pink MOST form) --  "MOST" Form in Place? --         IV Access:   Peripheral IV   Procedures and diagnostic studies:   DG Chest 2 View  Result Date: 01/17/2022 CLINICAL DATA:  37 year old female with fever headache and body ache. Possible sepsis. EXAM: CHEST - 2 VIEW COMPARISON:  Portable chest 0600 hours today. FINDINGS: PA and lateral views at 0657 hours. Some volume loss in the left hemithorax with superimposed confluent medial left upper lobe opacity and extensive surrounding peripheral upper lobe nodular opacity. Furthermore, there is contralateral right upper lobe lung nodularity, with multiple subcentimeter nodules apparent. No superimposed pneumothorax or pleural effusion. Mediastinal contours seem to remain normal. There is some leftward shift of the trachea. No acute osseous abnormality identified. Negative visible bowel gas. Cholecystectomy clips. IMPRESSION: 1. Constellation of left greater than right upper lobe widespread pulmonary nodularity, superimposed medial left upper lobe consolidation. No pleural effusion. In this age group and given fever, a disseminated bilateral lung infection is most likely. Followup PA and lateral chest X-ray is recommended in 3-4 weeks following trial of antibiotic therapy to ensure resolution and exclude underlying malignancy. 2. Associated left lung volume loss with some leftward shift of the trachea and mediastinum, but mediastinal contours seem to remain normal. Electronically Signed  By: Odessa Fleming M.D.   On: 01/17/2022 07:35   CT CHEST WO CONTRAST  Result Date: 01/17/2022 CLINICAL DATA:  Pneumonia, complication suspected EXAM: CT CHEST WITHOUT CONTRAST TECHNIQUE: Multidetector CT imaging of the chest was performed following the standard protocol without IV contrast.  RADIATION DOSE REDUCTION: This exam was performed according to the departmental dose-optimization program which includes automated exposure control, adjustment of the mA and/or kV according to patient size and/or use of iterative reconstruction technique. COMPARISON:  Chest radiograph done earlier today FINDINGS: Cardiovascular: Unremarkable. Mediastinum/Nodes: No significant lymphadenopathy seen. Lungs/Pleura: There are numerous nodular densities of varying sizes in the left upper lobe and left lower lobe. There are few small nodular densities in the right upper lobe. Most of these nodules show central calcification. There is ectasia of bronchi in the left upper lobe. There is decreased volume in the left lung in comparison to the right side. There is no pleural effusion or pneumothorax. Upper Abdomen: Spleen measures 15.1 cm in AP diameter. Surgical clips are seen in the gallbladder fossa. Small hiatal hernia is seen. Musculoskeletal: Unremarkable. IMPRESSION: There are numerous nodules of varying sizes in the left upper lobe, left lower lobe and right upper lobe. Most of these nodules show central calcification. Left lung is smaller than right suggesting scarring. Findings suggest chronic inflammation. Follow-up CT chest in 3 months may be considered to assess stability. There is no focal pulmonary consolidation. There is no pleural effusion. There is ectasia of bronchi in the left upper lobe. Enlarged spleen.  Small hiatal hernia. Electronically Signed   By: Ernie Avena M.D.   On: 01/17/2022 12:14   DG Chest Port 1 View  Result Date: 01/17/2022 CLINICAL DATA:  36 year old female with history of fever and body aches for the past 3 days. Possible sepsis. EXAM: PORTABLE CHEST 1 VIEW COMPARISON:  No priors. FINDINGS: Suboptimal highly rotated and under penetrated portable examination submitted for evaluation. Patchy areas of interstitial prominence an ill-defined opacities throughout the left mid to upper  lung. Right lung is clear. No definite pleural effusions. No pneumothorax. No evidence of pulmonary edema. Heart size is borderline enlarged. The patient is rotated to the left on today's exam, resulting in distortion of the mediastinal contours and reduced diagnostic sensitivity and specificity for mediastinal pathology. IMPRESSION: 1. Poor quality study demonstrating potential bronchopneumonia in the left upper lobe. Correlation with standing PA and lateral chest radiograph is recommended to better evaluate these findings. Electronically Signed   By: Trudie Reed M.D.   On: 01/17/2022 06:16   US Abdomen Limited RUQ (LIVER/GB)  Result Date: 01/17/2022 CLINICAL DATA:  Abnormal LFTs.  Cholecystectomy. EXAM: ULTRASOUND ABDOMEN LIMITED RIGHT UPPER QUADRANT COMPARISON:  None Available. FINDINGS: Gallbladder: Status post cholecystectomy. Common bile duct: Diameter: 3 mm Liver: No focal lesion identified. Within normal limits in parenchymal echogenicity. Portal vein is patent on color Doppler imaging with normal direction of blood flow towards the liver. Other: None. IMPRESSION: 1. Normal right upper quadrant sonogram. Liver is unremarkable without evidence of focal lesion or biliary ductal dilatation. 2.  Status post cholecystectomy. Electronically Signed   By: Larose Hires D.O.   On: 01/17/2022 11:30     Medical Consultants:   None.   Subjective:    Desarie Feild relates she feels about the same this morning no further fevers.  Objective:    Vitals:   01/18/22 0300 01/18/22 0400 01/18/22 0500 01/18/22 0511  BP:  (!) 151/103    Pulse: (!) 119 (!) 129  Resp: (!) 27 (!) 30    Temp:  (!) 103.2 F (39.6 C)  100.2 F (37.9 C)  TempSrc:  Oral  Oral  SpO2: 98% 98%    Weight:   92.8 kg   Height:       SpO2: 98 %   Intake/Output Summary (Last 24 hours) at 01/18/2022 0707 Last data filed at 01/18/2022 0400 Gross per 24 hour  Intake 5313.22 ml  Output --  Net 5313.22 ml   Filed Weights    01/17/22 0516 01/17/22 0539 01/18/22 0500  Weight: 78 kg 90.1 kg 92.8 kg    Exam: General exam: In no acute distress. Respiratory system: Good air movement and clear to auscultation. Cardiovascular system: S1 & S2 heard, RRR. No JVD. Gastrointestinal system: Abdomen is nondistended, soft and nontender.  Extremities: No pedal edema. Skin: No rashes, lesions or ulcers Psychiatry: Judgement and insight appear normal. Mood & affect appropriate.    Data Reviewed:    Labs: Basic Metabolic Panel: Recent Labs  Lab 01/17/22 0524 01/18/22 0313  NA 138 140  K 4.0 3.7  CL 106 111  CO2 21* 22  GLUCOSE 146* 98  BUN 11 6  CREATININE 0.96 0.65  CALCIUM 8.9 8.8*   GFR Estimated Creatinine Clearance: 108.3 mL/min (by C-G formula based on SCr of 0.65 mg/dL). Liver Function Tests: Recent Labs  Lab 01/17/22 0524  AST 38  ALT 45*  ALKPHOS 79  BILITOT 1.3*  PROT 7.3  ALBUMIN 4.1   No results for input(s): LIPASE, AMYLASE in the last 168 hours. No results for input(s): AMMONIA in the last 168 hours. Coagulation profile Recent Labs  Lab 01/17/22 0524  INR 1.4*   COVID-19 Labs  No results for input(s): DDIMER, FERRITIN, LDH, CRP in the last 72 hours.  Lab Results  Component Value Date   SARSCOV2NAA NEGATIVE 01/17/2022    CBC: Recent Labs  Lab 01/17/22 0524 01/18/22 0313  WBC 10.9* 9.2  NEUTROABS 9.3*  --   HGB 13.5 12.3  HCT 40.2 36.9  MCV 89.7 88.5  PLT 258 220   Cardiac Enzymes: No results for input(s): CKTOTAL, CKMB, CKMBINDEX, TROPONINI in the last 168 hours. BNP (last 3 results) No results for input(s): PROBNP in the last 8760 hours. CBG: No results for input(s): GLUCAP in the last 168 hours. D-Dimer: No results for input(s): DDIMER in the last 72 hours. Hgb A1c: Recent Labs    01/17/22 0525  HGBA1C 4.5*   Lipid Profile: No results for input(s): CHOL, HDL, LDLCALC, TRIG, CHOLHDL, LDLDIRECT in the last 72 hours. Thyroid function studies: No results  for input(s): TSH, T4TOTAL, T3FREE, THYROIDAB in the last 72 hours.  Invalid input(s): FREET3 Anemia work up: No results for input(s): VITAMINB12, FOLATE, FERRITIN, TIBC, IRON, RETICCTPCT in the last 72 hours. Sepsis Labs: Recent Labs  Lab 01/17/22 0524 01/17/22 0728 01/18/22 0313  WBC 10.9*  --  9.2  LATICACIDVEN 2.1* 1.1  --    Microbiology Recent Results (from the past 240 hour(s))  Resp Panel by RT-PCR (Flu A&B, Covid)     Status: None   Collection Time: 01/17/22  5:27 AM   Specimen: Nasal Swab  Result Value Ref Range Status   SARS Coronavirus 2 by RT PCR NEGATIVE NEGATIVE Final    Comment: (NOTE) SARS-CoV-2 target nucleic acids are NOT DETECTED.  The SARS-CoV-2 RNA is generally detectable in upper respiratory specimens during the acute phase of infection. The lowest concentration of SARS-CoV-2 viral copies this assay can detect  is 138 copies/mL. A negative result does not preclude SARS-Cov-2 infection and should not be used as the sole basis for treatment or other patient management decisions. A negative result may occur with  improper specimen collection/handling, submission of specimen other than nasopharyngeal swab, presence of viral mutation(s) within the areas targeted by this assay, and inadequate number of viral copies(<138 copies/mL). A negative result must be combined with clinical observations, patient history, and epidemiological information. The expected result is Negative.  Fact Sheet for Patients:  BloggerCourse.com  Fact Sheet for Healthcare Providers:  SeriousBroker.it  This test is no t yet approved or cleared by the [place] FDA and  has been authorized for detection and/or diagnosis of SARS-CoV-2 by FDA under an Emergency Use Authorization (EUA). This EUA will remain  in effect (meaning this test can be used) for the duration of the COVID-19 declaration under Section 564(b)(1) of the Act,  21 U.S.C.section 360bbb-3(b)(1), unless the authorization is terminated  or revoked sooner.       Influenza A by PCR NEGATIVE NEGATIVE Final   Influenza B by PCR NEGATIVE NEGATIVE Final    Comment: (NOTE) The Xpert Xpress SARS-CoV-2/FLU/RSV plus assay is intended as an aid in the diagnosis of influenza from Nasopharyngeal swab specimens and should not be used as a sole basis for treatment. Nasal washings and aspirates are unacceptable for Xpert Xpress SARS-CoV-2/FLU/RSV testing.  Fact Sheet for Patients: BloggerCourse.com  Fact Sheet for Healthcare Providers: SeriousBroker.it  This test is not yet approved or cleared by the [place] FDA and has been authorized for detection and/or diagnosis of SARS-CoV-2 by FDA under an Emergency Use Authorization (EUA). This EUA will remain in effect (meaning this test can be used) for the duration of the COVID-19 declaration under Section 564(b)(1) of the Act, 21 U.S.C. section 360bbb-3(b)(1), unless the authorization is terminated or revoked.  Performed at [facility], [address], [place], [place] [address]   MRSA Next Gen by PCR, Nasal     Status: None   Collection Time: 01/17/22  5:30 PM   Specimen: Nasal Mucosa; Nasal Swab  Result Value Ref Range Status   MRSA by PCR Next Gen NOT DETECTED NOT DETECTED Final    Comment: (NOTE) The GeneXpert MRSA Assay (FDA approved for NASAL specimens only), is one component of a comprehensive MRSA colonization surveillance program. It is not intended to diagnose MRSA infection nor to guide or monitor treatment for MRSA infections. Test performance is not FDA approved in patients less than 41 years old. Performed at [facility], [address], [place], [place] [address]   Respiratory (~20 pathogens) panel by PCR     Status: None   Collection Time: 01/17/22  6:58 PM   Specimen: Nasopharyngeal Swab;  Respiratory  Result Value Ref Range Status   Adenovirus NOT DETECTED NOT DETECTED Final   Coronavirus 229E NOT DETECTED NOT DETECTED Final    Comment: (NOTE) The Coronavirus on the Respiratory Panel, DOES NOT test for the novel  Coronavirus (2019 nCoV)    Coronavirus HKU1 NOT DETECTED NOT DETECTED Final   Coronavirus NL63 NOT DETECTED NOT DETECTED Final   Coronavirus OC43 NOT DETECTED NOT DETECTED Final   Metapneumovirus NOT DETECTED NOT DETECTED Final   Rhinovirus / Enterovirus NOT DETECTED NOT DETECTED Final   Influenza A NOT DETECTED NOT DETECTED Final   Influenza B NOT DETECTED NOT DETECTED Final   Parainfluenza Virus 1 NOT DETECTED NOT DETECTED Final   Parainfluenza Virus 2  NOT DETECTED NOT DETECTED Final   Parainfluenza Virus 3 NOT DETECTED NOT DETECTED Final   Parainfluenza Virus 4 NOT DETECTED NOT DETECTED Final   Respiratory Syncytial Virus NOT DETECTED NOT DETECTED Final   Bordetella pertussis NOT DETECTED NOT DETECTED Final   Bordetella Parapertussis NOT DETECTED NOT DETECTED Final   Chlamydophila pneumoniae NOT DETECTED NOT DETECTED Final   Mycoplasma pneumoniae NOT DETECTED NOT DETECTED Final    Comment: Performed at [facility], [address] [place], [place] [address]     Medications:    Chlorhexidine Gluconate Cloth  6 each Topical Q0600   mouth rinse  15 mL Mouth Rinse BID   Continuous Infusions:  levofloxacin (LEVAQUIN) IV Stopped (01/17/22 1249)   metronidazole Stopped (01/18/22 0612)      LOS: 1 day   Marinda ElkAbraham Feliz Ortiz  [facility]  01/18/2022, 7:07 AM

## 2022-01-18 NOTE — Telephone Encounter (Signed)
Patient scheduled for HFU on 03/01/2022 at 11:30am with Dr. Thora Lance. Appointment reminder mailed to patient. Nothing further needed.

## 2022-01-18 NOTE — Consult Note (Signed)
   NAME:  Misty Heath, MRN:  [id number], DOB:  18-Jan-1986, LOS: 1 ADMISSION DATE:  01/17/2022, CONSULTATION DATE:  6/1 REFERRING MD:  Olevia Bowens, CHIEF COMPLAINT:  abnormal cxr    History of Present Illness:  36 year old female who presented to ER 6/1 w/ cc: fever, HA and generalized body ache w/ fatigue and weakness on-going for approx 3d.  In ER temp 106.2 HR 170s.  RA sats 91%, RVP negative for influenza and Covid.  CXR showed LUL airspace disease and CT imaging was recommended and obtained. On CT imaging showed LUL bronchiectasis w/ multiple calcified pulmonary nodules. Because of this imaging PCCM asked to evaluate.  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  Has chronic cough  Significant Hospital Events: Including procedures, antibiotic start and stop dates in addition to other pertinent events   6/1 admitted w/ fever, chills, HA, malaise, working dx PNA and sepsis. Lactate 2.1, RT-PCR neg, U antigens checked, IV fluid bolus administered. Started on IV flagyl and levaquin. PCCM asked to evaluate due to left upper lobe btx and cavitary lung nodules.  6/2 febrile this morning, bronch/BAL at bedside  Interim History / Subjective:  No acute issues other than fever this morning.   Objective   Blood pressure (!) 194/104, pulse (!) 116, temperature 98.3 F (36.8 C), temperature source Oral, resp. rate (!) 26, height _0  (1.626 m), weight 92.8 kg, SpO2 100 %.        Intake/Output Summary (Last 24 hours) at 01/18/2022 1254 Last data filed at 01/18/2022 0800 Gross per 24 hour  Intake 2690.15 ml  Output --  Net 2690.15 ml    Filed Weights   01/17/22 0516 01/17/22 0539 01/18/22 0500  Weight: 78 kg 90.1 kg 92.8 kg    Examination: General: resting in bed NAD  HENT: NCAT MMM Lungs: ctab, normal wob Cardiovascular: tachy RR w/out MRG Abdomen: soft, NT Extremities: warm and dry  Neuro: awake and oriented, grossly nonfocal  Labs reviewed by me: Monospot,  RVP neg, HIV neg, s pneumo urine Ag neg  Resolved Hospital Problem list     Assessment & Plan:   # Bronchiectasis, LUL/superior segment LLL predominant # Multiple calcified pulmonary nodules - together with splenomegaly 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 Doesn't seem like she's having bronchiectasis exacerbation though - she has no productive cough.   P:  - f/u bronch/BAL cultures, cell count/diff - follow blood cultures, ABX per primary - if AFB smear from BAL negative ok to discharge - doesn't have transmissible infection even if she does have reactivation - I will set up clinic follow up for her in our clinic later this month/July   Will sign off but glad to be reinvolved as condition changes   [place]

## 2022-01-18 NOTE — Procedures (Signed)
Bronchoscopy Procedure Note  Mardene Lessig  [id number]  [birth year]  Date:01/18/22  Time:1:05 PM   Provider Performing:Arlen Dupuis M Verlee Monte   Procedure(s):  Flexible bronchoscopy with bronchial alveolar lavage [id number])  Indication(s) Fever Bronchiectasis, pulmonary nodules  History of tuberculosis  Consent Risks of the procedure as well as the alternatives and risks of each were explained to the patient and/or caregiver.  Consent for the procedure was obtained and is signed in the bedside chart  Anesthesia See MAR for details   Time Out Verified patient identification, verified procedure, site/side was marked, verified correct patient position, special equipment/implants available, medications/allergies/relevant history reviewed, required imaging and test results available.   Sterile Technique Usual hand hygiene, masks, gowns, and gloves were used   Procedure Description Bronchoscope initially advanced through nares but nasal passage too narrow. Then advanced through mouth and into airway.  Airways were examined down to subsegmental level with findings noted below.   Following diagnostic evaluation, BAL(s) performed in LUL with normal saline and return of 20cc fluid  Findings:  - Airway pitting/crypts more prominent in left sided airways, a few mucoid secretions which were suctioned, otherwise unremarkable airway exam - BAL performed in LUL anterior segment and sent for cultures   Complications/Tolerance None; patient tolerated the procedure well. Chest X-ray is not needed post procedure.   EBL Minimal   Specimen(s) BAL sent for micro

## 2022-01-18 NOTE — Telephone Encounter (Signed)
Can we set up clinic follow up with me in ~6 weeks for history of TB, bronchiectasis, chronic cough?   Thanks! Nate

## 2022-01-19 LAB — URINE CULTURE
Culture: 70000 — AB
Special Requests: NORMAL

## 2022-01-19 MED ORDER — ZOLPIDEM TARTRATE 5 MG PO TABS: 5.0000 mg | ORAL_TABLET | Freq: Every evening | ORAL | Status: DC | PRN

## 2022-01-19 NOTE — Progress Notes (Addendum)
[facility] PROGRESS NOTE    Progress Note  Misty Heath  PQZ:[id number] DOB: 06-30-1986 DOA: 01/17/2022 PCP: Pcp, No     Brief Narrative:   Misty Heath is an 36 y.o. female past medical history of class I obesity comes into the emergency room for 3 days of fever headache generalized body aches was found to have a fever 102 pulse of 177 respiration of 22 blood pressure 117/70 satting satting 91% on room air she was fluid resuscitated in the ED, white count of 11 chest radiograph showed a left greater than right right upper lobe wedge and a left upper lobe pneumonia pulmonary nodularity   Assessment/Plan:   Superior segment left lung infiltrate with multiple calcified nodules: She is now being weaned to room air she continues to spike fevers, 103. Continue levaquin Airborne isolation. Acid-fast testing pending Sputum culture and blood cultures are negative till date. CT of the chest was done that showed multiple pulmonary nodules of different size with central calcification with a splenomegaly concern about granulomatous process, question sarcoid Pulmonary was consulted recommended isolation and bronchoscopy on 01/18/2022. N.p.o.   Class I obesity: Allow with diet  Hyperglycemia: With an A1c of 4.5 lifestyle and diet modifications.  Mildly elevated LFTs: Alkaline phos and bilirubin within normal follow-up with PCP as an outpatient question fatty liver   DVT prophylaxis: lovenox Family Communication:Husband Status is: Inpatient Remains inpatient appropriate because: Multifocal pneumonia with newly granulomatous disease    Code Status:     Code Status Orders  (From admission, onward)           Start     Ordered   01/17/22 0636  Full code  Continuous        01/17/22 0636           Code Status History     This patient has a current code status but no historical code status.      Advance Directive Documentation    Flowsheet Row Most Recent Value   Type of Advance Directive Healthcare Power of Attorney  Pre-existing out of facility DNR order (yellow form or pink MOST form) --  "MOST" Form in Place? --         IV Access:   Peripheral IV   Procedures and diagnostic studies:   CT CHEST WO CONTRAST  Result Date: 01/17/2022 CLINICAL DATA:  Pneumonia, complication suspected EXAM: CT CHEST WITHOUT CONTRAST TECHNIQUE: Multidetector CT imaging of the chest was performed following the standard protocol without IV contrast. RADIATION DOSE REDUCTION: This exam was performed according to the departmental dose-optimization program which includes automated exposure control, adjustment of the mA and/or kV according to patient size and/or use of iterative reconstruction technique. COMPARISON:  Chest radiograph done earlier today FINDINGS: Cardiovascular: Unremarkable. Mediastinum/Nodes: No significant lymphadenopathy seen. Lungs/Pleura: There are numerous nodular densities of varying sizes in the left upper lobe and left lower lobe. There are few small nodular densities in the right upper lobe. Most of these nodules show central calcification. There is ectasia of bronchi in the left upper lobe. There is decreased volume in the left lung in comparison to the right side. There is no pleural effusion or pneumothorax. Upper Abdomen: Spleen measures 15.1 cm in AP diameter. Surgical clips are seen in the gallbladder fossa. Small hiatal hernia is seen. Musculoskeletal: Unremarkable. IMPRESSION: There are numerous nodules of varying sizes in the left upper lobe, left lower lobe and right upper lobe. Most of these nodules show central calcification. Left lung is  smaller than right suggesting scarring. Findings suggest chronic inflammation. Follow-up CT chest in 3 months may be considered to assess stability. There is no focal pulmonary consolidation. There is no pleural effusion. There is ectasia of bronchi in the left upper lobe. Enlarged spleen.  Small hiatal  hernia. Electronically Signed   By: Elmer Picker M.D.   On: 01/17/2022 12:14   US Abdomen Limited RUQ (LIVER/GB)  Result Date: 01/17/2022 CLINICAL DATA:  Abnormal LFTs.  Cholecystectomy. EXAM: ULTRASOUND ABDOMEN LIMITED RIGHT UPPER QUADRANT COMPARISON:  None Available. FINDINGS: Gallbladder: Status post cholecystectomy. Common bile duct: Diameter: 3 mm Liver: No focal lesion identified. Within normal limits in parenchymal echogenicity. Portal vein is patent on color Doppler imaging with normal direction of blood flow towards the liver. Other: None. IMPRESSION: 1. Normal right upper quadrant sonogram. Liver is unremarkable without evidence of focal lesion or biliary ductal dilatation. 2.  Status post cholecystectomy. Electronically Signed   By: Keane Police D.O.   On: 01/17/2022 11:30     Medical Consultants:   None.   Subjective:    [name] Feels better but nervous did not have a good night sleep  Objective:    Vitals:   01/18/22 2353 01/19/22 0000 01/19/22 0351 01/19/22 0500  BP:      Pulse: (!) 101 (!) 103    Resp: 18 17    Temp: 99.9 F (37.7 C)  99.8 F (37.7 C)   TempSrc: Oral  Oral   SpO2: 99% 98%    Weight:    88.2 kg  Height:       SpO2: 98 % O2 Flow Rate (L/min): 2 L/min   Intake/Output Summary (Last 24 hours) at 01/19/2022 0722 Last data filed at 01/18/2022 1800 Gross per 24 hour  Intake 247.17 ml  Output --  Net 247.17 ml    Filed Weights   01/17/22 0539 01/18/22 0500 01/19/22 0500  Weight: 90.1 kg 92.8 kg 88.2 kg    Exam: General exam: In no acute distress. Respiratory system: Good air movement and clear to auscultation. Cardiovascular system: S1 & S2 heard, RRR. No JVD. Gastrointestinal system: Abdomen is nondistended, soft and nontender.  Extremities: No pedal edema. Skin: No rashes, lesions or ulcers Psychiatry: Judgement and insight appear normal. Mood & affect appropriate.    Data Reviewed:    Labs: Basic Metabolic  Panel: Recent Labs  Lab 01/17/22 0524 01/18/22 0313  NA 138 140  K 4.0 3.7  CL 106 111  CO2 21* 22  GLUCOSE 146* 98  BUN 11 6  CREATININE 0.96 0.65  CALCIUM 8.9 8.8*    GFR Estimated Creatinine Clearance: 105.5 mL/min (by C-G formula based on SCr of 0.65 mg/dL). Liver Function Tests: Recent Labs  Lab 01/17/22 0524  AST 38  ALT 45*  ALKPHOS 79  BILITOT 1.3*  PROT 7.3  ALBUMIN 4.1    No results for input(s): LIPASE, AMYLASE in the last 168 hours. No results for input(s): AMMONIA in the last 168 hours. Coagulation profile Recent Labs  Lab 01/17/22 0524  INR 1.4*    COVID-19 Labs  No results for input(s): DDIMER, FERRITIN, LDH, CRP in the last 72 hours.  Lab Results  Component Value Date   [place] NEGATIVE 01/17/2022    CBC: Recent Labs  Lab 01/17/22 0524 01/18/22 0313  WBC 10.9* 9.2  NEUTROABS 9.3*  --   HGB 13.5 12.3  HCT 40.2 36.9  MCV 89.7 88.5  PLT 258 220    Cardiac Enzymes: No  results for input(s): CKTOTAL, CKMB, CKMBINDEX, TROPONINI in the last 168 hours. BNP (last 3 results) No results for input(s): PROBNP in the last 8760 hours. CBG: No results for input(s): GLUCAP in the last 168 hours. D-Dimer: No results for input(s): DDIMER in the last 72 hours. Hgb A1c: Recent Labs    01/17/22 0525  HGBA1C 4.5*    Lipid Profile: No results for input(s): CHOL, HDL, LDLCALC, TRIG, CHOLHDL, LDLDIRECT in the last 72 hours. Thyroid function studies: No results for input(s): TSH, T4TOTAL, T3FREE, THYROIDAB in the last 72 hours.  Invalid input(s): FREET3 Anemia work up: No results for input(s): VITAMINB12, FOLATE, FERRITIN, TIBC, IRON, RETICCTPCT in the last 72 hours. Sepsis Labs: Recent Labs  Lab 01/17/22 0524 01/17/22 0728 01/18/22 0313  WBC 10.9*  --  9.2  LATICACIDVEN 2.1* 1.1  --     Microbiology Recent Results (from the past 240 hour(s))  Blood Culture (routine x 2)     Status: None (Preliminary result)   Collection Time:  01/17/22  5:25 AM   Specimen: BLOOD  Result Value Ref Range Status   Specimen Description   Final    BLOOD BLOOD LEFT FOREARM Performed at [facility] [address], [place], [place] [address]    Special Requests   Final    BOTTLES DRAWN AEROBIC ONLY Blood Culture results may not be optimal due to an inadequate volume of blood received in culture bottles Performed at [facility] [address] [place], [place] [address]    Culture   Final    NO GROWTH 2 DAYS Performed at [facility] [address], [place], [place] [address]    Report Status PENDING  Incomplete  Resp Panel by RT-PCR (Flu A&B, Covid)     Status: None   Collection Time: 01/17/22  5:27 AM   Specimen: Nasal Swab  Result Value Ref Range Status   SARS Coronavirus 2 by RT PCR NEGATIVE NEGATIVE Final    Comment: (NOTE) SARS-CoV-2 target nucleic acids are NOT DETECTED.  The SARS-CoV-2 RNA is generally detectable in upper respiratory specimens during the acute phase of infection. The lowest concentration of SARS-CoV-2 viral copies this assay can detect is 138 copies/mL. A negative result does not preclude SARS-Cov-2 infection and should not be used as the sole basis for treatment or other patient management decisions. A negative result may occur with  improper specimen collection/handling, submission of specimen other than nasopharyngeal swab, presence of viral mutation(s) within the areas targeted by this assay, and inadequate number of viral copies(<138 copies/mL). A negative result must be combined with clinical observations, patient history, and epidemiological information. The expected result is Negative.  Fact Sheet for Patients:  EntrepreneurPulse.com.au  Fact Sheet for Healthcare Providers:  IncredibleEmployment.be  This test is no t yet approved or cleared by the [place] FDA and  has been authorized for detection  and/or diagnosis of SARS-CoV-2 by FDA under an Emergency Use Authorization (EUA). This EUA will remain  in effect (meaning this test can be used) for the duration of the COVID-19 declaration under Section 564(b)(1) of the Act, 21 U.S.C.section 360bbb-3(b)(1), unless the authorization is terminated  or revoked sooner.       Influenza A by PCR NEGATIVE NEGATIVE Final   Influenza B by PCR NEGATIVE NEGATIVE Final    Comment: (NOTE) The Xpert Xpress SARS-CoV-2/FLU/RSV plus assay is intended as an aid in the diagnosis of influenza from Nasopharyngeal swab specimens and should not be  used as a sole basis for treatment. Nasal washings and aspirates are unacceptable for Xpert Xpress SARS-CoV-2/FLU/RSV testing.  Fact Sheet for Patients: EntrepreneurPulse.com.au  Fact Sheet for Healthcare Providers: IncredibleEmployment.be  This test is not yet approved or cleared by the [place] FDA and has been authorized for detection and/or diagnosis of SARS-CoV-2 by FDA under an Emergency Use Authorization (EUA). This EUA will remain in effect (meaning this test can be used) for the duration of the COVID-19 declaration under Section 564(b)(1) of the Act, 21 U.S.C. section 360bbb-3(b)(1), unless the authorization is terminated or revoked.  Performed at [facility], [place] [address] [place], [place] [address]   Blood Culture (routine x 2)     Status: None (Preliminary result)   Collection Time: 01/17/22  5:28 AM   Specimen: BLOOD  Result Value Ref Range Status   Specimen Description   Final    BLOOD RIGHT ANTECUBITAL Performed at [facility] [address] [place], [place] [address]    Special Requests   Final    BOTTLES DRAWN AEROBIC AND ANAEROBIC Blood Culture results may not be optimal due to an excessive volume of blood received in culture bottles Performed at [facility] [address], [place], [place] [address]    Culture   Final    NO GROWTH 2 DAYS Performed at [facility] [address], [place], [place] [address]    Report Status PENDING  Incomplete  Urine Culture     Status: Abnormal (Preliminary result)   Collection Time: 01/17/22  6:28 AM   Specimen: In/Out Cath Urine  Result Value Ref Range Status   Specimen Description   Final    IN/OUT CATH URINE Performed at [facility] [address] [place], [place] [address]    Special Requests   Final    Normal Performed at [facility], [place] [address] [place], [place] [address]    Culture 70,000 COLONIES/mL ESCHERICHIA COLI (A)  Final   Report Status PENDING  Incomplete  MRSA Next Gen by PCR, Nasal     Status: None   Collection Time: 01/17/22  5:30 PM   Specimen: Nasal Mucosa; Nasal Swab  Result Value Ref Range Status   MRSA by PCR Next Gen NOT DETECTED NOT DETECTED Final    Comment: (NOTE) The GeneXpert MRSA Assay (FDA approved for NASAL specimens only), is one component of a comprehensive MRSA colonization surveillance program. It is not intended to diagnose MRSA infection nor to guide or monitor treatment for MRSA infections. Test performance is not FDA approved in patients less than 18 years old. Performed at [facility], [place] [address], [place], [place] [address]   Respiratory (~20 pathogens) panel by PCR     Status: None   Collection Time: 01/17/22  6:58 PM   Specimen: Tracheal Aspirate; Respiratory  Result Value Ref Range Status   Adenovirus NOT DETECTED NOT DETECTED Final   Coronavirus 229E NOT DETECTED NOT DETECTED Final    Comment: (NOTE) The Coronavirus on the Respiratory Panel, DOES NOT test for the novel  Coronavirus (2019 nCoV)    Coronavirus HKU1 NOT DETECTED NOT DETECTED Final   Coronavirus NL63 NOT DETECTED NOT DETECTED Final   Coronavirus OC43 NOT DETECTED NOT DETECTED Final   Metapneumovirus NOT DETECTED NOT  DETECTED Final   Rhinovirus / Enterovirus NOT DETECTED NOT DETECTED Final   Influenza A NOT DETECTED NOT DETECTED Final   Influenza  B NOT DETECTED NOT DETECTED Final   Parainfluenza Virus 1 NOT DETECTED NOT DETECTED Final   Parainfluenza Virus 2 NOT DETECTED NOT DETECTED Final   Parainfluenza Virus 3 NOT DETECTED NOT DETECTED Final   Parainfluenza Virus 4 NOT DETECTED NOT DETECTED Final   Respiratory Syncytial Virus NOT DETECTED NOT DETECTED Final   Bordetella pertussis NOT DETECTED NOT DETECTED Final   Bordetella Parapertussis NOT DETECTED NOT DETECTED Final   Chlamydophila pneumoniae NOT DETECTED NOT DETECTED Final   Mycoplasma pneumoniae NOT DETECTED NOT DETECTED Final    Comment: Performed at [facility], [place] [address] [place], [place] [address]  Culture, BAL-quantitative w Gram Stain     Status: None (Preliminary result)   Collection Time: 01/18/22 11:37 AM   Specimen: Bronchoalveolar Lavage; Respiratory  Result Value Ref Range Status   Specimen Description   Final    BRONCHIAL ALVEOLAR LAVAGE Performed at [facility] [address], [place], [place] [address]    Special Requests   Final    NONE Performed at [facility], [place] [address], [place], [place] [address]    Gram Stain   Final    NO SQUAMOUS EPITHELIAL CELLS SEEN MODERATE WBC SEEN NO ORGANISMS SEEN Performed at [facility], [place] [address], [place], [place] [address]    Culture PENDING  Incomplete   Report Status PENDING  Incomplete     Medications:    Chlorhexidine Gluconate Cloth  6 each Topical Q0600   mouth rinse  15 mL Mouth Rinse BID   Continuous Infusions:  sodium chloride Stopped (01/18/22 1754)   levofloxacin (LEVAQUIN) IV Stopped (01/18/22 1343)   metronidazole 500 mg (01/19/22 2162)      LOS: 2 days   Charlynne Cousins  [facility]  01/19/2022, 7:22 AM

## 2022-01-20 DIAGNOSIS — A419 Sepsis, unspecified organism: Secondary | ICD-10-CM

## 2022-01-20 LAB — CULTURE, BAL-QUANTITATIVE W GRAM STAIN
Culture: 20000 — AB
Gram Stain: NONE SEEN

## 2022-01-20 NOTE — Plan of Care (Signed)
Plan of care reviewed and discussed with the patient. 

## 2022-01-20 NOTE — Progress Notes (Signed)
Pt c/o nausea with no vomiting. Responded to room with Zofran yet pt refused med. Reassurance given. Instructed to call back if nausea persisted w/verbalized understanding. Husband at bedside.

## 2022-01-20 NOTE — Progress Notes (Signed)
[facility] PROGRESS NOTE    Progress Note  Misty Heath  DGU:[id number] DOB: 05-31-1986 DOA: 01/17/2022 PCP: Pcp, No     Brief Narrative:   Misty Heath is an 36 y.o. female past medical history of class I obesity comes into the emergency room for 3 days of fever headache generalized body aches was found to have a fever 102 pulse of 177 respiration of 22 blood pressure 117/70 satting satting 91% on room air she was fluid resuscitated in the ED, white count of 11 chest radiograph showed a left greater than right right upper lobe wedge and a left upper lobe pneumonia pulmonary nodularity   Assessment/Plan:   Superior segment left lung infiltrate with multiple calcified nodules: Has been weaned to room air, with a Tmax of 97.8. Continue levaquin Airborne isolation. Acid-fast testing results are still pending. Sputum culture and blood cultures are negative till date.  Class I obesity: Allow with diet  Hyperglycemia: With an A1c of 4.5 lifestyle and diet modifications.  Mildly elevated LFTs: Alkaline phos and bilirubin within normal follow-up with PCP as an outpatient question fatty liver   DVT prophylaxis: lovenox Family Communication:Husband Status is: Inpatient Remains inpatient appropriate because: Multifocal pneumonia with newly granulomatous disease    Code Status:     Code Status Orders  (From admission, onward)           Start     Ordered   01/17/22 0636  Full code  Continuous        01/17/22 0636           Code Status History     This patient has a current code status but no historical code status.      Advance Directive Documentation    Flowsheet Row Most Recent Value  Type of Advance Directive Healthcare Power of Attorney  Pre-existing out of facility DNR order (yellow form or pink MOST form) --  "MOST" Form in Place? --         IV Access:   Peripheral IV   Procedures and diagnostic studies:   No results  found.   Medical Consultants:   None.   Subjective:    Misty Heath has no new complaints.  Objective:    Vitals:   01/19/22 1812 01/19/22 2111 01/20/22 0124 01/20/22 0559  BP: 137/89 139/75 137/69 139/75  Pulse: 83 87 92 80  Resp:  _0 Temp: 97.9 F (36.6 C) 99.1 F (37.3 C) 99.5 F (37.5 C) 99.7 F (37.6 C)  TempSrc: Oral Oral Oral Oral  SpO2: 99% 100% 99% 100%  Weight:      Height:       SpO2: 100 % O2 Flow Rate (L/min): 2 L/min   Intake/Output Summary (Last 24 hours) at 01/20/2022 0938 Last data filed at 01/20/2022 0600 Gross per 24 hour  Intake 759.02 ml  Output --  Net 759.02 ml    Filed Weights   01/17/22 0539 01/18/22 0500 01/19/22 0500  Weight: 90.1 kg 92.8 kg 88.2 kg    Exam: General exam: In no acute distress. Respiratory system: Good air movement and clear to auscultation. Cardiovascular system: S1 & S2 heard, RRR. No JVD. Gastrointestinal system: Abdomen is nondistended, soft and nontender.  Extremities: No pedal edema. Skin: No rashes, lesions or ulcers Psychiatry: Judgement and insight appear normal. Mood & affect appropriate.   Data Reviewed:    Labs: Basic Metabolic Panel: Recent Labs  Lab 01/17/22 0524 01/18/22 0313  NA 138 140  K 4.0 3.7  CL 106 111  CO2 21* 22  GLUCOSE 146* 98  BUN 11 6  CREATININE 0.96 0.65  CALCIUM 8.9 8.8*    GFR Estimated Creatinine Clearance: 105.5 mL/min (by C-G formula based on SCr of 0.65 mg/dL). Liver Function Tests: Recent Labs  Lab 01/17/22 0524  AST 38  ALT 45*  ALKPHOS 79  BILITOT 1.3*  PROT 7.3  ALBUMIN 4.1    No results for input(s): LIPASE, AMYLASE in the last 168 hours. No results for input(s): AMMONIA in the last 168 hours. Coagulation profile Recent Labs  Lab 01/17/22 0524  INR 1.4*    COVID-19 Labs  No results for input(s): DDIMER, FERRITIN, LDH, CRP in the last 72 hours.  Lab Results  Component Value Date   [place] NEGATIVE 01/17/2022     CBC: Recent Labs  Lab 01/17/22 0524 01/18/22 0313  WBC 10.9* 9.2  NEUTROABS 9.3*  --   HGB 13.5 12.3  HCT 40.2 36.9  MCV 89.7 88.5  PLT 258 220    Cardiac Enzymes: No results for input(s): CKTOTAL, CKMB, CKMBINDEX, TROPONINI in the last 168 hours. BNP (last 3 results) No results for input(s): PROBNP in the last 8760 hours. CBG: No results for input(s): GLUCAP in the last 168 hours. D-Dimer: No results for input(s): DDIMER in the last 72 hours. Hgb A1c: No results for input(s): HGBA1C in the last 72 hours.  Lipid Profile: No results for input(s): CHOL, HDL, LDLCALC, TRIG, CHOLHDL, LDLDIRECT in the last 72 hours. Thyroid function studies: No results for input(s): TSH, T4TOTAL, T3FREE, THYROIDAB in the last 72 hours.  Invalid input(s): FREET3 Anemia work up: No results for input(s): VITAMINB12, FOLATE, FERRITIN, TIBC, IRON, RETICCTPCT in the last 72 hours. Sepsis Labs: Recent Labs  Lab 01/17/22 0524 01/17/22 0728 01/18/22 0313  WBC 10.9*  --  9.2  LATICACIDVEN 2.1* 1.1  --     Microbiology Recent Results (from the past 240 hour(s))  Blood Culture (routine x 2)     Status: None (Preliminary result)   Collection Time: 01/17/22  5:25 AM   Specimen: BLOOD  Result Value Ref Range Status   Specimen Description   Final    BLOOD BLOOD LEFT FOREARM Performed at [facility] [address] [place], [place] [address]    Special Requests   Final    BOTTLES DRAWN AEROBIC ONLY Blood Culture results may not be optimal due to an inadequate volume of blood received in culture bottles Performed at [facility] [address], [place], [place] [address]    Culture   Final    NO GROWTH 3 DAYS Performed at [facility], [place] [address] [place], [place] [address]    Report Status PENDING  Incomplete  Resp Panel by RT-PCR (Flu A&B, Covid)     Status: None   Collection Time: 01/17/22  5:27 AM   Specimen: Nasal Swab  Result  Value Ref Range Status   SARS Coronavirus 2 by RT PCR NEGATIVE NEGATIVE Final    Comment: (NOTE) SARS-CoV-2 target nucleic acids are NOT DETECTED.  The SARS-CoV-2 RNA is generally detectable in upper respiratory specimens during the acute phase of infection. The lowest concentration of SARS-CoV-2 viral copies this assay can detect is 138 copies/mL. A negative result does not preclude SARS-Cov-2 infection and should not be used as the sole basis for treatment or other patient management decisions. A negative result may occur with  improper specimen collection/handling, submission of  specimen other than nasopharyngeal swab, presence of viral mutation(s) within the areas targeted by this assay, and inadequate number of viral copies(<138 copies/mL). A negative result must be combined with clinical observations, patient history, and epidemiological information. The expected result is Negative.  Fact Sheet for Patients:  EntrepreneurPulse.com.au  Fact Sheet for Healthcare Providers:  IncredibleEmployment.be  This test is no t yet approved or cleared by the [place] FDA and  has been authorized for detection and/or diagnosis of SARS-CoV-2 by FDA under an Emergency Use Authorization (EUA). This EUA will remain  in effect (meaning this test can be used) for the duration of the COVID-19 declaration under Section 564(b)(1) of the Act, 21 U.S.C.section 360bbb-3(b)(1), unless the authorization is terminated  or revoked sooner.       Influenza A by PCR NEGATIVE NEGATIVE Final   Influenza B by PCR NEGATIVE NEGATIVE Final    Comment: (NOTE) The Xpert Xpress SARS-CoV-2/FLU/RSV plus assay is intended as an aid in the diagnosis of influenza from Nasopharyngeal swab specimens and should not be used as a sole basis for treatment. Nasal washings and aspirates are unacceptable for Xpert Xpress SARS-CoV-2/FLU/RSV testing.  Fact Sheet for  Patients: EntrepreneurPulse.com.au  Fact Sheet for Healthcare Providers: IncredibleEmployment.be  This test is not yet approved or cleared by the [place] FDA and has been authorized for detection and/or diagnosis of SARS-CoV-2 by FDA under an Emergency Use Authorization (EUA). This EUA will remain in effect (meaning this test can be used) for the duration of the COVID-19 declaration under Section 564(b)(1) of the Act, 21 U.S.C. section 360bbb-3(b)(1), unless the authorization is terminated or revoked.  Performed at [facility], [place] [address], [place], [place] [address]   Blood Culture (routine x 2)     Status: None (Preliminary result)   Collection Time: 01/17/22  5:28 AM   Specimen: BLOOD  Result Value Ref Range Status   Specimen Description   Final    BLOOD RIGHT ANTECUBITAL Performed at [facility] [address] [place], [place] [address]    Special Requests   Final    BOTTLES DRAWN AEROBIC AND ANAEROBIC Blood Culture results may not be optimal due to an excessive volume of blood received in culture bottles Performed at [facility] [address], [place], [place] [address]    Culture   Final    NO GROWTH 3 DAYS Performed at [facility], [place] [address], [place], [place] [address]    Report Status PENDING  Incomplete  Urine Culture     Status: Abnormal   Collection Time: 01/17/22  6:28 AM   Specimen: In/Out Cath Urine  Result Value Ref Range Status   Specimen Description   Final    IN/OUT CATH URINE Performed at [facility] [address], [place], [place] [address]    Special Requests   Final    Normal Performed at [facility], [place] [address], [place], [place] [address]    Culture 70,000 COLONIES/mL ESCHERICHIA COLI (A)  Final   Report Status 01/19/2022 FINAL  Final   Organism ID, Bacteria ESCHERICHIA COLI (A)   Final      Susceptibility   Escherichia coli - MIC*    AMPICILLIN <=2 SENSITIVE Sensitive     CEFAZOLIN <=4 SENSITIVE Sensitive     CEFEPIME <=0.12 SENSITIVE Sensitive     CEFTRIAXONE <=0.25 SENSITIVE Sensitive     CIPROFLOXACIN <=0.25 SENSITIVE Sensitive  GENTAMICIN <=1 SENSITIVE Sensitive     IMIPENEM <=0.25 SENSITIVE Sensitive     NITROFURANTOIN <=16 SENSITIVE Sensitive     TRIMETH/SULFA <=20 SENSITIVE Sensitive     AMPICILLIN/SULBACTAM <=2 SENSITIVE Sensitive     PIP/TAZO <=4 SENSITIVE Sensitive     * 70,000 COLONIES/mL ESCHERICHIA COLI  MRSA Next Gen by PCR, Nasal     Status: None   Collection Time: 01/17/22  5:30 PM   Specimen: Nasal Mucosa; Nasal Swab  Result Value Ref Range Status   MRSA by PCR Next Gen NOT DETECTED NOT DETECTED Final    Comment: (NOTE) The GeneXpert MRSA Assay (FDA approved for NASAL specimens only), is one component of a comprehensive MRSA colonization surveillance program. It is not intended to diagnose MRSA infection nor to guide or monitor treatment for MRSA infections. Test performance is not FDA approved in patients less than 42 years old. Performed at [facility], [place] [address], [place], [place] [address]   Respiratory (~20 pathogens) panel by PCR     Status: None   Collection Time: 01/17/22  6:58 PM   Specimen: Tracheal Aspirate; Respiratory  Result Value Ref Range Status   Adenovirus NOT DETECTED NOT DETECTED Final   Coronavirus 229E NOT DETECTED NOT DETECTED Final    Comment: (NOTE) The Coronavirus on the Respiratory Panel, DOES NOT test for the novel  Coronavirus (2019 nCoV)    Coronavirus HKU1 NOT DETECTED NOT DETECTED Final   Coronavirus NL63 NOT DETECTED NOT DETECTED Final   Coronavirus OC43 NOT DETECTED NOT DETECTED Final   Metapneumovirus NOT DETECTED NOT DETECTED Final   Rhinovirus / Enterovirus NOT DETECTED NOT DETECTED Final   Influenza A NOT DETECTED NOT DETECTED Final   Influenza B NOT DETECTED NOT  DETECTED Final   Parainfluenza Virus 1 NOT DETECTED NOT DETECTED Final   Parainfluenza Virus 2 NOT DETECTED NOT DETECTED Final   Parainfluenza Virus 3 NOT DETECTED NOT DETECTED Final   Parainfluenza Virus 4 NOT DETECTED NOT DETECTED Final   Respiratory Syncytial Virus NOT DETECTED NOT DETECTED Final   Bordetella pertussis NOT DETECTED NOT DETECTED Final   Bordetella Parapertussis NOT DETECTED NOT DETECTED Final   Chlamydophila pneumoniae NOT DETECTED NOT DETECTED Final   Mycoplasma pneumoniae NOT DETECTED NOT DETECTED Final    Comment: Performed at [facility], [place]. [address] [place], [place] [address]  Culture, BAL-quantitative w Gram Stain     Status: None (Preliminary result)   Collection Time: 01/18/22 11:37 AM   Specimen: Bronchoalveolar Lavage; Respiratory  Result Value Ref Range Status   Specimen Description   Final    BRONCHIAL ALVEOLAR LAVAGE Performed at [facility] [address], [place], [place] [address]    Special Requests   Final    NONE Performed at [facility], [place] [address] [place], [place] [address]    Gram Stain   Final    NO SQUAMOUS EPITHELIAL CELLS SEEN MODERATE WBC SEEN NO ORGANISMS SEEN    Culture   Final    NO GROWTH < 12 HOURS Performed at [facility], [place] [address] [place], [place] [address]    Report Status PENDING  Incomplete  Culture, fungus without smear     Status: None (Preliminary result)   Collection Time: 01/18/22 11:37 AM   Specimen: Bronchoalveolar Lavage; Other  Result Value Ref Range Status   Specimen Description   Final    BRONCHIAL ALVEOLAR LAVAGE Performed at [facility],  [place] [address] [address], [place], [place] [address]    Special Requests   Final    NONE Performed at [facility], [place] [address], [place], [place] [address]    Culture   Final    NO GROWTH < 24 HOURS Performed at [facility] [address] [place],   [address]    Report Status PENDING  Incomplete     Medications:    Chlorhexidine Gluconate Cloth  6 each Topical Q0600   mouth rinse  15 mL Mouth Rinse BID   Continuous Infusions:  sodium chloride Stopped (01/18/22 1754)   levofloxacin (LEVAQUIN) IV Stopped (01/19/22 1400)      LOS: 3 days   Charlynne Cousins  [facility]  01/20/2022, 9:38 AM

## 2022-01-21 LAB — ACID FAST SMEAR (AFB, MYCOBACTERIA): Acid Fast Smear: NEGATIVE

## 2022-01-21 LAB — LEGIONELLA PNEUMOPHILA SEROGP 1 UR AG: L. pneumophila Serogp 1 Ur Ag: NEGATIVE

## 2022-01-21 NOTE — Progress Notes (Signed)
Transition of Care [facility]) Screening Note  Patient Details  Name: Misty Heath Date of Birth: 04-03-1986  Transition of Care [facility]) CM/SW Contact:    Ewing Schlein, LCSW Phone Number: 01/21/2022, 2:24 PM  Transition of Care Department [facility]) has reviewed patient and no TOC needs have been identified at this time. We will continue to monitor patient advancement through interdisciplinary progression rounds. If new patient transition needs arise, please place a TOC consult.

## 2022-01-21 NOTE — Consult Note (Signed)
[facility]       Reason for Consult: left upper lobe nodules   Referring Physician: Dr. Carola Frost  Principal Problem:   CAP (community acquired pneumonia) Active Problems:   Class 1 obesity   Bronchiectasis ([place])   Pulmonary calcification determined by X-ray   Hyperglycemia   Abnormal LFTs     Recommendations: Can stop levaquin  Assessment: She has calcified pulmonary nodules and splenomegaly with consideration of pulmonary Tb reactivation.  Waiting for results.   At this time, if her AFB smear is positive, she will start Tb treatment via the health department.  If AFB smear is negative, will wait on results for any consideration of treatment.  She has follow up with pulmonary next month.  Ok from [place] standpoint for discharge prior to AFB smear results.  She is agreeable to self-isolate until results known and health department will contact her if positive.   I will arrange follow up in the ID clinic in 3-4 weeks to monitor results.   I will continue to monitor if she remains inpatient.   Antibiotics: Levaquin 5 days Total antibiotics 7 days  HPI: Misty Heath is a 36 y.o. female with a history of tuberculosis s/p 1 year of treatment completed about 13 years ago who presented to the ED with fever, aches and tachycardia.  Tmax 106, WBC 10.9.  She was started on empiric therapy with vancomycin and aztreonam then levofloxacin and seen by pulmonary who performed a BAL, results pending.   Husband is at the bedside.  Feels much better and at her baseline. Does have bronchiectasis.    Review of Systems:  Constitutional: negative for fevers, chills, sweats, fatigue, and weight loss Respiratory: negative for cough, sputum, hemoptysis, or dyspnea on exertion All other systems reviewed and are negative    Past Medical History:  Diagnosis Date   Class 1 obesity 01/17/2022   Tuberculosis    Diagnosed around 2011.  Treated for about a year.    Social  History   Tobacco Use   Smoking status: Never   Smokeless tobacco: Never  Substance Use Topics   Alcohol use: Never   Drug use: Never    History reviewed. No pertinent family history.  Allergies  Allergen Reactions   Ceclor [Cefaclor] Anaphylaxis and Shortness Of Breath    Physical Exam: Constitutional: in no apparent distress  Vitals:   01/20/22 2134 01/21/22 0649  BP: 137/84 (!) 145/80  Pulse: 93 88  Resp: 18 16  Temp: 99.2 F (37.3 C) 98 F (36.7 C)  SpO2: 97% 100%   EYES: anicteric Respiratory: CTA B; normal respiratory effort Musculoskeletal: no edema Skin: no rash  Lab Results  Component Value Date   WBC 9.2 01/18/2022   HGB 12.3 01/18/2022   HCT 36.9 01/18/2022   MCV 88.5 01/18/2022   PLT 220 01/18/2022    Lab Results  Component Value Date   CREATININE 0.65 01/18/2022   BUN 6 01/18/2022   NA 140 01/18/2022   K 3.7 01/18/2022   CL 111 01/18/2022   CO2 22 01/18/2022    Lab Results  Component Value Date   ALT 45 (H) 01/17/2022   AST 38 01/17/2022   ALKPHOS 79 01/17/2022     Microbiology: Recent Results (from the past 240 hour(s))  Blood Culture (routine x 2)     Status: None (Preliminary result)   Collection Time: 01/17/22  5:25 AM   Specimen: BLOOD  Result Value Ref Range Status  Specimen Description   Final    BLOOD BLOOD LEFT FOREARM Performed at [facility] [address] [place], [place] [address]    Special Requests   Final    BOTTLES DRAWN AEROBIC ONLY Blood Culture results may not be optimal due to an inadequate volume of blood received in culture bottles Performed at [facility] [address] [place], [place] [address]    Culture   Final    NO GROWTH 4 DAYS Performed at [facility], [place] [address] [place], [place] [address]    Report Status PENDING  Incomplete  Resp Panel by RT-PCR (Flu A&B, Covid)     Status: None   Collection Time: 01/17/22  5:27 AM   Specimen: Nasal  Swab  Result Value Ref Range Status   SARS Coronavirus 2 by RT PCR NEGATIVE NEGATIVE Final    Comment: (NOTE) SARS-CoV-2 target nucleic acids are NOT DETECTED.  The SARS-CoV-2 RNA is generally detectable in upper respiratory specimens during the acute phase of infection. The lowest concentration of SARS-CoV-2 viral copies this assay can detect is 138 copies/mL. A negative result does not preclude SARS-Cov-2 infection and should not be used as the sole basis for treatment or other patient management decisions. A negative result may occur with  improper specimen collection/handling, submission of specimen other than nasopharyngeal swab, presence of viral mutation(s) within the areas targeted by this assay, and inadequate number of viral copies(<138 copies/mL). A negative result must be combined with clinical observations, patient history, and epidemiological information. The expected result is Negative.  Fact Sheet for Patients:  EntrepreneurPulse.com.au  Fact Sheet for Healthcare Providers:  IncredibleEmployment.be  This test is no t yet approved or cleared by the [place] FDA and  has been authorized for detection and/or diagnosis of SARS-CoV-2 by FDA under an Emergency Use Authorization (EUA). This EUA will remain  in effect (meaning this test can be used) for the duration of the COVID-19 declaration under Section 564(b)(1) of the Act, 21 U.S.C.section 360bbb-3(b)(1), unless the authorization is terminated  or revoked sooner.       Influenza A by PCR NEGATIVE NEGATIVE Final   Influenza B by PCR NEGATIVE NEGATIVE Final    Comment: (NOTE) The Xpert Xpress SARS-CoV-2/FLU/RSV plus assay is intended as an aid in the diagnosis of influenza from Nasopharyngeal swab specimens and should not be used as a sole basis for treatment. Nasal washings and aspirates are unacceptable for Xpert Xpress SARS-CoV-2/FLU/RSV testing.  Fact Sheet for  Patients: EntrepreneurPulse.com.au  Fact Sheet for Healthcare Providers: IncredibleEmployment.be  This test is not yet approved or cleared by the [place] FDA and has been authorized for detection and/or diagnosis of SARS-CoV-2 by FDA under an Emergency Use Authorization (EUA). This EUA will remain in effect (meaning this test can be used) for the duration of the COVID-19 declaration under Section 564(b)(1) of the Act, 21 U.S.C. section 360bbb-3(b)(1), unless the authorization is terminated or revoked.  Performed at [facility], [place] [address], [place], [place] [address]   Blood Culture (routine x 2)     Status: None (Preliminary result)   Collection Time: 01/17/22  5:28 AM   Specimen: BLOOD  Result Value Ref Range Status   Specimen Description   Final    BLOOD RIGHT ANTECUBITAL Performed at [facility] [address], [place],  [address]    Special Requests   Final    BOTTLES DRAWN AEROBIC AND ANAEROBIC Blood  Culture results may not be optimal due to an excessive volume of blood received in culture bottles Performed at [facility] [address], [place], [place] [address]    Culture   Final    NO GROWTH 4 DAYS Performed at [facility], [place] [address], [place], [place] [address]    Report Status PENDING  Incomplete  Urine Culture     Status: Abnormal   Collection Time: 01/17/22  6:28 AM   Specimen: In/Out Cath Urine  Result Value Ref Range Status   Specimen Description   Final    IN/OUT CATH URINE Performed at [facility] [address], [place], [place] [address]    Special Requests   Final    Normal Performed at [facility], [place] [address] [place], [place] [address]    Culture 70,000 COLONIES/mL ESCHERICHIA COLI (A)  Final   Report Status 01/19/2022 FINAL  Final   Organism ID, Bacteria ESCHERICHIA COLI (A)   Final      Susceptibility   Escherichia coli - MIC*    AMPICILLIN <=2 SENSITIVE Sensitive     CEFAZOLIN <=4 SENSITIVE Sensitive     CEFEPIME <=0.12 SENSITIVE Sensitive     CEFTRIAXONE <=0.25 SENSITIVE Sensitive     CIPROFLOXACIN <=0.25 SENSITIVE Sensitive     GENTAMICIN <=1 SENSITIVE Sensitive     IMIPENEM <=0.25 SENSITIVE Sensitive     NITROFURANTOIN <=16 SENSITIVE Sensitive     TRIMETH/SULFA <=20 SENSITIVE Sensitive     AMPICILLIN/SULBACTAM <=2 SENSITIVE Sensitive     PIP/TAZO <=4 SENSITIVE Sensitive     * 70,000 COLONIES/mL ESCHERICHIA COLI  MRSA Next Gen by PCR, Nasal     Status: None   Collection Time: 01/17/22  5:30 PM   Specimen: Nasal Mucosa; Nasal Swab  Result Value Ref Range Status   MRSA by PCR Next Gen NOT DETECTED NOT DETECTED Final    Comment: (NOTE) The GeneXpert MRSA Assay (FDA approved for NASAL specimens only), is one component of a comprehensive MRSA colonization surveillance program. It is not intended to diagnose MRSA infection nor to guide or monitor treatment for MRSA infections. Test performance is not FDA approved in patients less than 46 years old. Performed at [facility], [place] [address], [place], [place] [address]   Respiratory (~20 pathogens) panel by PCR     Status: None   Collection Time: 01/17/22  6:58 PM   Specimen: Tracheal Aspirate; Respiratory  Result Value Ref Range Status   Adenovirus NOT DETECTED NOT DETECTED Final   Coronavirus 229E NOT DETECTED NOT DETECTED Final    Comment: (NOTE) The Coronavirus on the Respiratory Panel, DOES NOT test for the novel  Coronavirus (2019 nCoV)    Coronavirus HKU1 NOT DETECTED NOT DETECTED Final   Coronavirus NL63 NOT DETECTED NOT DETECTED Final   Coronavirus OC43 NOT DETECTED NOT DETECTED Final   Metapneumovirus NOT DETECTED NOT DETECTED Final   Rhinovirus / Enterovirus NOT DETECTED NOT DETECTED Final   Influenza A NOT DETECTED NOT DETECTED Final   Influenza B NOT DETECTED NOT  DETECTED Final   Parainfluenza Virus 1 NOT DETECTED NOT DETECTED Final   Parainfluenza Virus 2 NOT DETECTED NOT DETECTED Final   Parainfluenza Virus 3 NOT DETECTED NOT DETECTED Final   Parainfluenza Virus 4 NOT DETECTED NOT DETECTED Final   Respiratory Syncytial Virus NOT DETECTED NOT DETECTED Final   Bordetella pertussis NOT DETECTED NOT DETECTED Final   Bordetella Parapertussis NOT DETECTED NOT  DETECTED Final   Chlamydophila pneumoniae NOT DETECTED NOT DETECTED Final   Mycoplasma pneumoniae NOT DETECTED NOT DETECTED Final    Comment: Performed at [facility], [place] [address] [place], [place] [address]  Culture, BAL-quantitative w Gram Stain     Status: Abnormal   Collection Time: 01/18/22 11:37 AM   Specimen: Bronchoalveolar Lavage; Respiratory  Result Value Ref Range Status   Specimen Description   Final    BRONCHIAL ALVEOLAR LAVAGE Performed at [facility] [address], [place], [place] [address]    Special Requests   Final    NONE Performed at [facility], [place] [address], [place], [place] [address]    Gram Stain   Final    NO SQUAMOUS EPITHELIAL CELLS SEEN MODERATE WBC SEEN NO ORGANISMS SEEN    Culture (A)  Final    20,000 COLONIES/mL Consistent with normal respiratory flora. Performed at [facility], [place] [address], [place], [place] [address]    Report Status 01/20/2022 FINAL  Final  Culture, fungus without smear     Status: None (Preliminary result)   Collection Time: 01/18/22 11:37 AM   Specimen: Bronchoalveolar Lavage; Other  Result Value Ref Range Status   Specimen Description   Final    BRONCHIAL ALVEOLAR LAVAGE Performed at [facility] [address], [place], [place] [address]    Special Requests   Final    NONE Performed at [facility], [place] [address], [place], [place] [address]    Culture   Final    NO FUNGUS ISOLATED AFTER 3 DAYS Performed at [facility], [place] [address], [place], [place] [address]    Report Status PENDING  Incomplete    Thayer Headings, [facility] [place] [email or web address] 01/21/2022, 12:59 PM

## 2022-01-21 NOTE — Progress Notes (Signed)
Patient discharged to home w/ SO. Verbalized understanding of all instructions. Patient declined w/c. Ambulated to POV w/ mask.

## 2022-01-21 NOTE — Progress Notes (Signed)
[facility] PROGRESS NOTE    Progress Note  Misty Heath  UDT:[id number] DOB: 1986-08-03 DOA: 01/17/2022 PCP: Pcp, No     Brief Narrative:   Misty Heath is an 36 y.o. female past medical history of class I obesity comes into the emergency room for 3 days of fever headache generalized body aches was found to have a fever 102 pulse of 177 respiration of 22 blood pressure 117/70 satting satting 91% on room air she was fluid resuscitated in the ED, white count of 11 chest radiograph showed a left greater than right right upper lobe wedge and a left upper lobe pneumonia pulmonary nodularity   Assessment/Plan:   Superior segment left lung infiltrate with multiple calcified nodules: Has been afebrile. Continue levaquin today is day 5. Airborne isolation. Acid-fast testing results are still pending. Sputum culture and blood cultures are negative till date.  Class I obesity: Allow with diet  Hyperglycemia: With an A1c of 4.5 lifestyle and diet modifications.  Mildly elevated LFTs: Alkaline phos and bilirubin within normal follow-up with PCP as an outpatient question fatty liver   DVT prophylaxis: lovenox Family Communication:Husband Status is: Inpatient Remains inpatient appropriate because: Multifocal pneumonia with newly granulomatous disease    Code Status:     Code Status Orders  (From admission, onward)           Start     Ordered   01/17/22 0636  Full code  Continuous        01/17/22 0636           Code Status History     This patient has a current code status but no historical code status.      Advance Directive Documentation    Flowsheet Row Most Recent Value  Type of Advance Directive Healthcare Power of Attorney  Pre-existing out of facility DNR order (yellow form or pink MOST form) --  "MOST" Form in Place? --         IV Access:   Peripheral IV   Procedures and diagnostic studies:   No results found.   Medical  Consultants:   None.   Subjective:    Magin Balbi no complaints  Objective:    Vitals:   01/20/22 0559 01/20/22 1423 01/20/22 2134 01/21/22 0649  BP: 139/75 (!) 146/70 137/84 (!) 145/80  Pulse: 80 (!) 104 93 88  Resp: _0 Temp: 99.7 F (37.6 C) 98.8 F (37.1 C) 99.2 F (37.3 C) 98 F (36.7 C)  TempSrc: Oral Oral Oral Oral  SpO2: 100% 100% 97% 100%  Weight:      Height:       SpO2: 100 % O2 Flow Rate (L/min): 2 L/min   Intake/Output Summary (Last 24 hours) at 01/21/2022 1011 Last data filed at 01/21/2022 0214 Gross per 24 hour  Intake 840 ml  Output 0 ml  Net 840 ml    Filed Weights   01/17/22 0539 01/18/22 0500 01/19/22 0500  Weight: 90.1 kg 92.8 kg 88.2 kg    Exam: General exam: In no acute distress. Respiratory system: Good air movement and clear to auscultation. Cardiovascular system: S1 & S2 heard, RRR. No JVD. Gastrointestinal system: Abdomen is nondistended, soft and nontender.  Extremities: No pedal edema. Skin: No rashes, lesions or ulcers Psychiatry: Judgement and insight appear normal. Mood & affect appropriate.   Data Reviewed:    Labs: Basic Metabolic Panel: Recent Labs  Lab 01/17/22 0524 01/18/22 0313  NA 138 140  K 4.0  3.7  CL 106 111  CO2 21* 22  GLUCOSE 146* 98  BUN 11 6  CREATININE 0.96 0.65  CALCIUM 8.9 8.8*    GFR Estimated Creatinine Clearance: 105.5 mL/min (by C-G formula based on SCr of 0.65 mg/dL). Liver Function Tests: Recent Labs  Lab 01/17/22 0524  AST 38  ALT 45*  ALKPHOS 79  BILITOT 1.3*  PROT 7.3  ALBUMIN 4.1    No results for input(s): LIPASE, AMYLASE in the last 168 hours. No results for input(s): AMMONIA in the last 168 hours. Coagulation profile Recent Labs  Lab 01/17/22 0524  INR 1.4*    COVID-19 Labs  No results for input(s): DDIMER, FERRITIN, LDH, CRP in the last 72 hours.  Lab Results  Component Value Date   [place] NEGATIVE 01/17/2022    CBC: Recent Labs  Lab  01/17/22 0524 01/18/22 0313  WBC 10.9* 9.2  NEUTROABS 9.3*  --   HGB 13.5 12.3  HCT 40.2 36.9  MCV 89.7 88.5  PLT 258 220    Cardiac Enzymes: No results for input(s): CKTOTAL, CKMB, CKMBINDEX, TROPONINI in the last 168 hours. BNP (last 3 results) No results for input(s): PROBNP in the last 8760 hours. CBG: No results for input(s): GLUCAP in the last 168 hours. D-Dimer: No results for input(s): DDIMER in the last 72 hours. Hgb A1c: No results for input(s): HGBA1C in the last 72 hours.  Lipid Profile: No results for input(s): CHOL, HDL, LDLCALC, TRIG, CHOLHDL, LDLDIRECT in the last 72 hours. Thyroid function studies: No results for input(s): TSH, T4TOTAL, T3FREE, THYROIDAB in the last 72 hours.  Invalid input(s): FREET3 Anemia work up: No results for input(s): VITAMINB12, FOLATE, FERRITIN, TIBC, IRON, RETICCTPCT in the last 72 hours. Sepsis Labs: Recent Labs  Lab 01/17/22 0524 01/17/22 0728 01/18/22 0313  WBC 10.9*  --  9.2  LATICACIDVEN 2.1* 1.1  --     Microbiology Recent Results (from the past 240 hour(s))  Blood Culture (routine x 2)     Status: None (Preliminary result)   Collection Time: 01/17/22  5:25 AM   Specimen: BLOOD  Result Value Ref Range Status   Specimen Description   Final    BLOOD BLOOD LEFT FOREARM Performed at [facility] [address], [place], [place] [address]    Special Requests   Final    BOTTLES DRAWN AEROBIC ONLY Blood Culture results may not be optimal due to an inadequate volume of blood received in culture bottles Performed at [facility] [address] [place], [place] [address]    Culture   Final    NO GROWTH 4 DAYS Performed at [facility], [place] [address], [place], [place] [address]    Report Status PENDING  Incomplete  Resp Panel by RT-PCR (Flu A&B, Covid)     Status: None   Collection Time: 01/17/22  5:27 AM   Specimen: Nasal Swab  Result Value Ref Range Status   SARS  Coronavirus 2 by RT PCR NEGATIVE NEGATIVE Final    Comment: (NOTE) SARS-CoV-2 target nucleic acids are NOT DETECTED.  The SARS-CoV-2 RNA is generally detectable in upper respiratory specimens during the acute phase of infection. The lowest concentration of SARS-CoV-2 viral copies this assay can detect is 138 copies/mL. A negative result does not preclude SARS-Cov-2 infection and should not be used as the sole basis for treatment or other patient management decisions. A negative result may occur with  improper specimen collection/handling, submission of specimen other  than nasopharyngeal swab, presence of viral mutation(s) within the areas targeted by this assay, and inadequate number of viral copies(<138 copies/mL). A negative result must be combined with clinical observations, patient history, and epidemiological information. The expected result is Negative.  Fact Sheet for Patients:  EntrepreneurPulse.com.au  Fact Sheet for Healthcare Providers:  IncredibleEmployment.be  This test is no t yet approved or cleared by the [place] FDA and  has been authorized for detection and/or diagnosis of SARS-CoV-2 by FDA under an Emergency Use Authorization (EUA). This EUA will remain  in effect (meaning this test can be used) for the duration of the COVID-19 declaration under Section 564(b)(1) of the Act, 21 U.S.C.section 360bbb-3(b)(1), unless the authorization is terminated  or revoked sooner.       Influenza A by PCR NEGATIVE NEGATIVE Final   Influenza B by PCR NEGATIVE NEGATIVE Final    Comment: (NOTE) The Xpert Xpress SARS-CoV-2/FLU/RSV plus assay is intended as an aid in the diagnosis of influenza from Nasopharyngeal swab specimens and should not be used as a sole basis for treatment. Nasal washings and aspirates are unacceptable for Xpert Xpress SARS-CoV-2/FLU/RSV testing.  Fact Sheet for  Patients: EntrepreneurPulse.com.au  Fact Sheet for Healthcare Providers: IncredibleEmployment.be  This test is not yet approved or cleared by the [place] FDA and has been authorized for detection and/or diagnosis of SARS-CoV-2 by FDA under an Emergency Use Authorization (EUA). This EUA will remain in effect (meaning this test can be used) for the duration of the COVID-19 declaration under Section 564(b)(1) of the Act, 21 U.S.C. section 360bbb-3(b)(1), unless the authorization is terminated or revoked.  Performed at [facility], [place] [address], [place], [place] [address]   Blood Culture (routine x 2)     Status: None (Preliminary result)   Collection Time: 01/17/22  5:28 AM   Specimen: BLOOD  Result Value Ref Range Status   Specimen Description   Final    BLOOD RIGHT ANTECUBITAL Performed at [facility] [address] [place], [place] [address]    Special Requests   Final    BOTTLES DRAWN AEROBIC AND ANAEROBIC Blood Culture results may not be optimal due to an excessive volume of blood received in culture bottles Performed at [facility] [address], [place], [place] [address]    Culture   Final    NO GROWTH 4 DAYS Performed at [facility], [place] [address], [place], [place] [address]    Report Status PENDING  Incomplete  Urine Culture     Status: Abnormal   Collection Time: 01/17/22  6:28 AM   Specimen: In/Out Cath Urine  Result Value Ref Range Status   Specimen Description   Final    IN/OUT CATH URINE Performed at [facility] [address], [place], [place] [address]    Special Requests   Final    Normal Performed at [facility], [place] [address] [place], [place] [address]    Culture 70,000 COLONIES/mL ESCHERICHIA COLI (A)  Final   Report Status 01/19/2022 FINAL  Final   Organism ID, Bacteria ESCHERICHIA COLI (A)   Final      Susceptibility   Escherichia coli - MIC*    AMPICILLIN <=2 SENSITIVE Sensitive     CEFAZOLIN <=4 SENSITIVE Sensitive     CEFEPIME <=0.12 SENSITIVE Sensitive     CEFTRIAXONE <=0.25 SENSITIVE Sensitive     CIPROFLOXACIN <=0.25 SENSITIVE Sensitive     GENTAMICIN <=1  SENSITIVE Sensitive     IMIPENEM <=0.25 SENSITIVE Sensitive     NITROFURANTOIN <=16 SENSITIVE Sensitive     TRIMETH/SULFA <=20 SENSITIVE Sensitive     AMPICILLIN/SULBACTAM <=2 SENSITIVE Sensitive     PIP/TAZO <=4 SENSITIVE Sensitive     * 70,000 COLONIES/mL ESCHERICHIA COLI  MRSA Next Gen by PCR, Nasal     Status: None   Collection Time: 01/17/22  5:30 PM   Specimen: Nasal Mucosa; Nasal Swab  Result Value Ref Range Status   MRSA by PCR Next Gen NOT DETECTED NOT DETECTED Final    Comment: (NOTE) The GeneXpert MRSA Assay (FDA approved for NASAL specimens only), is one component of a comprehensive MRSA colonization surveillance program. It is not intended to diagnose MRSA infection nor to guide or monitor treatment for MRSA infections. Test performance is not FDA approved in patients less than 53 years old. Performed at [facility], [place] [address] [place], [place] [address]   Respiratory (~20 pathogens) panel by PCR     Status: None   Collection Time: 01/17/22  6:58 PM   Specimen: Tracheal Aspirate; Respiratory  Result Value Ref Range Status   Adenovirus NOT DETECTED NOT DETECTED Final   Coronavirus 229E NOT DETECTED NOT DETECTED Final    Comment: (NOTE) The Coronavirus on the Respiratory Panel, DOES NOT test for the novel  Coronavirus (2019 nCoV)    Coronavirus HKU1 NOT DETECTED NOT DETECTED Final   Coronavirus NL63 NOT DETECTED NOT DETECTED Final   Coronavirus OC43 NOT DETECTED NOT DETECTED Final   Metapneumovirus NOT DETECTED NOT DETECTED Final   Rhinovirus / Enterovirus NOT DETECTED NOT DETECTED Final   Influenza A NOT DETECTED NOT DETECTED Final   Influenza B NOT DETECTED NOT  DETECTED Final   Parainfluenza Virus 1 NOT DETECTED NOT DETECTED Final   Parainfluenza Virus 2 NOT DETECTED NOT DETECTED Final   Parainfluenza Virus 3 NOT DETECTED NOT DETECTED Final   Parainfluenza Virus 4 NOT DETECTED NOT DETECTED Final   Respiratory Syncytial Virus NOT DETECTED NOT DETECTED Final   Bordetella pertussis NOT DETECTED NOT DETECTED Final   Bordetella Parapertussis NOT DETECTED NOT DETECTED Final   Chlamydophila pneumoniae NOT DETECTED NOT DETECTED Final   Mycoplasma pneumoniae NOT DETECTED NOT DETECTED Final    Comment: Performed at [facility], [place] [address], [place], [place] [address]  Culture, BAL-quantitative w Gram Stain     Status: Abnormal   Collection Time: 01/18/22 11:37 AM   Specimen: Bronchoalveolar Lavage; Respiratory  Result Value Ref Range Status   Specimen Description   Final    BRONCHIAL ALVEOLAR LAVAGE Performed at [facility] [address], [place], [place] [address]    Special Requests   Final    NONE Performed at [facility], [place] [address] [place], [place] [address]    Gram Stain   Final    NO SQUAMOUS EPITHELIAL CELLS SEEN MODERATE WBC SEEN NO ORGANISMS SEEN    Culture (A)  Final    20,000 COLONIES/mL Consistent with normal respiratory flora. Performed at [facility], [place] [address] [place], [place] [address]    Report Status 01/20/2022 FINAL  Final  Culture, fungus without smear     Status: None (Preliminary result)   Collection Time: 01/18/22 11:37 AM   Specimen: Bronchoalveolar Lavage; Other  Result Value Ref Range Status   Specimen Description   Final    BRONCHIAL ALVEOLAR LAVAGE Performed at [facility], [address] [place], [place] [address]    Special Requests   Final    NONE Performed at [facility], [place] [address] [place], [place] [address]    Culture   Final    NO FUNGUS ISOLATED AFTER 2 DAYS Performed at [facility], [place] [address] [place], [place] [address]    Report Status PENDING  Incomplete     Medications:     Continuous Infusions:  sodium chloride Stopped (01/18/22 1754)   levofloxacin (LEVAQUIN) IV 750 mg (01/20/22 1258)      LOS: 4 days   Charlynne Cousins  [facility]  01/21/2022, 10:11 AM

## 2022-01-21 NOTE — Discharge Summary (Signed)
Physician Discharge Summary  Misty Heath ZYS:[id number] DOB: 25-Feb-1986 DOA: 01/17/2022  PCP: Merryl Hacker, No  Admit date: 01/17/2022 Discharge date: 01/21/2022  Admitted From: [facility]  Disposition:  Home   Recommendations for Outpatient Follow-up:  Follow up with Pulmonary in 1-2 weeks Please obtain BMP/CBC in one week   Home Health:No  Equipment/Devices:None   Discharge Condition:Stable  CODE STATUS:Full   Diet recommendation: Heart Healthy  Brief/Interim Summary:  36 y.o. female past medical history of class I obesity comes into the emergency room for 3 days of fever headache generalized body aches was found to have a fever 102 pulse of 177 respiration of 22 blood pressure 117/70 satting satting 91% on room air she was fluid resuscitated in the ED, white count of 11 chest radiograph showed a left greater than right right upper lobe wedge and a left upper lobe pneumonia pulmonary nodularity      Discharge Diagnoses:  Principal Problem:   CAP (community acquired pneumonia) Active Problems:   Class 1 obesity   Bronchiectasis ([place])   Pulmonary calcification determined by X-ray   Hyperglycemia   Abnormal LFTs  Severe segmental left lower lobe infiltrate multiple calcified nodules: She was started on IV Levaquin pulmonary was consulted she was placed on isolation she completed a 5-day course of IV Levaquin BAL was done and a acid fast TB testing was negative. ID was consulted recommended follow-up with pulmonary as an outpatient.  Class I obesity: Diet and counseling.  Hyperglycemia: With an A1c of 4.5.  Mild elevated FTs: Follow-up with PCP as an outpatient.  Discharge Instructions  Discharge Instructions     Diet - low sodium heart healthy   Complete by: As directed    Increase activity slowly   Complete by: As directed       Allergies as of 01/21/2022       Reactions   Ceclor [cefaclor] Anaphylaxis, Shortness Of Breath        Medication List    You have  not been prescribed any medications.     Allergies  Allergen Reactions   Ceclor [Cefaclor] Anaphylaxis and Shortness Of Breath    Consultations: Pulm   Procedures/Studies: DG Chest 2 View  Result Date: 01/17/2022 CLINICAL DATA:  36 year old female with fever headache and body ache. Possible sepsis. EXAM: CHEST - 2 VIEW COMPARISON:  Portable chest 0600 hours today. FINDINGS: PA and lateral views at 0657 hours. Some volume loss in the left hemithorax with superimposed confluent medial left upper lobe opacity and extensive surrounding peripheral upper lobe nodular opacity. Furthermore, there is contralateral right upper lobe lung nodularity, with multiple subcentimeter nodules apparent. No superimposed pneumothorax or pleural effusion. Mediastinal contours seem to remain normal. There is some leftward shift of the trachea. No acute osseous abnormality identified. Negative visible bowel gas. Cholecystectomy clips. IMPRESSION: 1. Constellation of left greater than right upper lobe widespread pulmonary nodularity, superimposed medial left upper lobe consolidation. No pleural effusion. In this age group and given fever, a disseminated bilateral lung infection is most likely. Followup PA and lateral chest X-ray is recommended in 3-4 weeks following trial of antibiotic therapy to ensure resolution and exclude underlying malignancy. 2. Associated left lung volume loss with some leftward shift of the trachea and mediastinum, but mediastinal contours seem to remain normal. Electronically Signed   By: Genevie Ann M.D.   On: 01/17/2022 07:35   CT CHEST WO CONTRAST  Result Date: 01/17/2022 CLINICAL DATA:  Pneumonia, complication suspected EXAM: CT CHEST WITHOUT CONTRAST TECHNIQUE: Multidetector  CT imaging of the chest was performed following the standard protocol without IV contrast. RADIATION DOSE REDUCTION: This exam was performed according to the departmental dose-optimization program which includes automated  exposure control, adjustment of the mA and/or kV according to patient size and/or use of iterative reconstruction technique. COMPARISON:  Chest radiograph done earlier today FINDINGS: Cardiovascular: Unremarkable. Mediastinum/Nodes: No significant lymphadenopathy seen. Lungs/Pleura: There are numerous nodular densities of varying sizes in the left upper lobe and left lower lobe. There are few small nodular densities in the right upper lobe. Most of these nodules show central calcification. There is ectasia of bronchi in the left upper lobe. There is decreased volume in the left lung in comparison to the right side. There is no pleural effusion or pneumothorax. Upper Abdomen: Spleen measures 15.1 cm in AP diameter. Surgical clips are seen in the gallbladder fossa. Small hiatal hernia is seen. Musculoskeletal: Unremarkable. IMPRESSION: There are numerous nodules of varying sizes in the left upper lobe, left lower lobe and right upper lobe. Most of these nodules show central calcification. Left lung is smaller than right suggesting scarring. Findings suggest chronic inflammation. Follow-up CT chest in 3 months may be considered to assess stability. There is no focal pulmonary consolidation. There is no pleural effusion. There is ectasia of bronchi in the left upper lobe. Enlarged spleen.  Small hiatal hernia. Electronically Signed   By: Elmer Picker M.D.   On: 01/17/2022 12:14   DG Chest Port 1 View  Result Date: 01/17/2022 CLINICAL DATA:  36 year old female with history of fever and body aches for the past 3 days. Possible sepsis. EXAM: PORTABLE CHEST 1 VIEW COMPARISON:  No priors. FINDINGS: Suboptimal highly rotated and under penetrated portable examination submitted for evaluation. Patchy areas of interstitial prominence an ill-defined opacities throughout the left mid to upper lung. Right lung is clear. No definite pleural effusions. No pneumothorax. No evidence of pulmonary edema. Heart size is  borderline enlarged. The patient is rotated to the left on today's exam, resulting in distortion of the mediastinal contours and reduced diagnostic sensitivity and specificity for mediastinal pathology. IMPRESSION: 1. Poor quality study demonstrating potential bronchopneumonia in the left upper lobe. Correlation with standing PA and lateral chest radiograph is recommended to better evaluate these findings. Electronically Signed   By: Vinnie Langton M.D.   On: 01/17/2022 06:16   US Abdomen Limited RUQ (LIVER/GB)  Result Date: 01/17/2022 CLINICAL DATA:  Abnormal LFTs.  Cholecystectomy. EXAM: ULTRASOUND ABDOMEN LIMITED RIGHT UPPER QUADRANT COMPARISON:  None Available. FINDINGS: Gallbladder: Status post cholecystectomy. Common bile duct: Diameter: 3 mm Liver: No focal lesion identified. Within normal limits in parenchymal echogenicity. Portal vein is patent on color Doppler imaging with normal direction of blood flow towards the liver. Other: None. IMPRESSION: 1. Normal right upper quadrant sonogram. Liver is unremarkable without evidence of focal lesion or biliary ductal dilatation. 2.  Status post cholecystectomy. Electronically Signed   By: Keane Police D.O.   On: 01/17/2022 11:30   (Echo, Carotid, EGD, Colonoscopy, ERCP)    Subjective: None  Discharge Exam: Vitals:   01/21/22 0649 01/21/22 1345  BP: (!) 145/80 134/72  Pulse: 88 (!) 103  Resp: 16 16  Temp: 98 F (36.7 C) 98.9 F (37.2 C)  SpO2: 100% 100%   Vitals:   01/20/22 1423 01/20/22 2134 01/21/22 0649 01/21/22 1345  BP: (!) 146/70 137/84 (!) 145/80 134/72  Pulse: (!) 104 93 88 (!) 103  Resp:  _0 Temp: 98.8 F (37.1  C) 99.2 F (37.3 C) 98 F (36.7 C) 98.9 F (37.2 C)  TempSrc: Oral Oral Oral Oral  SpO2: 100% 97% 100% 100%  Weight:      Height:        General: Pt is alert, awake, not in acute distress Cardiovascular: RRR, S1/S2 +, no rubs, no gallops Respiratory: CTA bilaterally, no wheezing, no rhonchi Abdominal:  Soft, NT, ND, bowel sounds + Extremities: no edema, no cyanosis    The results of significant diagnostics from this hospitalization (including imaging, microbiology, ancillary and laboratory) are listed below for reference.     Microbiology: Recent Results (from the past 240 hour(s))  Blood Culture (routine x 2)     Status: None (Preliminary result)   Collection Time: 01/17/22  5:25 AM   Specimen: BLOOD  Result Value Ref Range Status   Specimen Description   Final    BLOOD BLOOD LEFT FOREARM Performed at [facility] [address], [place], [place] [address]    Special Requests   Final    BOTTLES DRAWN AEROBIC ONLY Blood Culture results may not be optimal due to an inadequate volume of blood received in culture bottles Performed at [facility] [address], [place], [place] [address]    Culture   Final    NO GROWTH 4 DAYS Performed at [facility], [place] [address], [place], [place] [address]    Report Status PENDING  Incomplete  Resp Panel by RT-PCR (Flu A&B, Covid)     Status: None   Collection Time: 01/17/22  5:27 AM   Specimen: Nasal Swab  Result Value Ref Range Status   SARS Coronavirus 2 by RT PCR NEGATIVE NEGATIVE Final    Comment: (NOTE) SARS-CoV-2 target nucleic acids are NOT DETECTED.  The SARS-CoV-2 RNA is generally detectable in upper respiratory specimens during the acute phase of infection. The lowest concentration of SARS-CoV-2 viral copies this assay can detect is 138 copies/mL. A negative result does not preclude SARS-Cov-2 infection and should not be used as the sole basis for treatment or other patient management decisions. A negative result may occur with  improper specimen collection/handling, submission of specimen other than nasopharyngeal swab, presence of viral mutation(s) within the areas targeted by this assay, and inadequate number of viral copies(<138 copies/mL). A negative result must be combined  with clinical observations, patient history, and epidemiological information. The expected result is Negative.  Fact Sheet for Patients:  EntrepreneurPulse.com.au  Fact Sheet for Healthcare Providers:  IncredibleEmployment.be  This test is no t yet approved or cleared by the [place] FDA and  has been authorized for detection and/or diagnosis of SARS-CoV-2 by FDA under an Emergency Use Authorization (EUA). This EUA will remain  in effect (meaning this test can be used) for the duration of the COVID-19 declaration under Section 564(b)(1) of the Act, 21 U.S.C.section 360bbb-3(b)(1), unless the authorization is terminated  or revoked sooner.       Influenza A by PCR NEGATIVE NEGATIVE Final   Influenza B by PCR NEGATIVE NEGATIVE Final    Comment: (NOTE) The Xpert Xpress SARS-CoV-2/FLU/RSV plus assay is intended as an aid in the diagnosis of influenza from Nasopharyngeal swab specimens and should not be used as a sole basis for treatment. Nasal washings and aspirates are unacceptable for Xpert Xpress SARS-CoV-2/FLU/RSV testing.  Fact Sheet for Patients: EntrepreneurPulse.com.au  Fact Sheet for Healthcare Providers: IncredibleEmployment.be  This test is not yet approved or cleared by the [place] FDA  and has been authorized for detection and/or diagnosis of SARS-CoV-2 by FDA under an Emergency Use Authorization (EUA). This EUA will remain in effect (meaning this test can be used) for the duration of the COVID-19 declaration under Section 564(b)(1) of the Act, 21 U.S.C. section 360bbb-3(b)(1), unless the authorization is terminated or revoked.  Performed at [facility], [place] [address], [place], [place] [address]   Blood Culture (routine x 2)     Status: None (Preliminary result)   Collection Time: 01/17/22  5:28 AM   Specimen: BLOOD  Result Value Ref Range Status   Specimen  Description   Final    BLOOD RIGHT ANTECUBITAL Performed at [facility] [address], [place], [place] [address]    Special Requests   Final    BOTTLES DRAWN AEROBIC AND ANAEROBIC Blood Culture results may not be optimal due to an excessive volume of blood received in culture bottles Performed at [facility] [address], [place], [place] [address]    Culture   Final    NO GROWTH 4 DAYS Performed at [facility], [place] [address] [place], [place] [address]    Report Status PENDING  Incomplete  Urine Culture     Status: Abnormal   Collection Time: 01/17/22  6:28 AM   Specimen: In/Out Cath Urine  Result Value Ref Range Status   Specimen Description   Final    IN/OUT CATH URINE Performed at [facility] [address], [place], [place] [address]    Special Requests   Final    Normal Performed at [facility], [place] [address] [place], [place] [address]    Culture 70,000 COLONIES/mL ESCHERICHIA COLI (A)  Final   Report Status 01/19/2022 FINAL  Final   Organism ID, Bacteria ESCHERICHIA COLI (A)  Final      Susceptibility   Escherichia coli - MIC*    AMPICILLIN <=2 SENSITIVE Sensitive     CEFAZOLIN <=4 SENSITIVE Sensitive     CEFEPIME <=0.12 SENSITIVE Sensitive     CEFTRIAXONE <=0.25 SENSITIVE Sensitive     CIPROFLOXACIN <=0.25 SENSITIVE Sensitive     GENTAMICIN <=1 SENSITIVE Sensitive     IMIPENEM <=0.25 SENSITIVE Sensitive     NITROFURANTOIN <=16 SENSITIVE Sensitive     TRIMETH/SULFA <=20 SENSITIVE Sensitive     AMPICILLIN/SULBACTAM <=2 SENSITIVE Sensitive     PIP/TAZO <=4 SENSITIVE Sensitive     * 70,000 COLONIES/mL ESCHERICHIA COLI  MRSA Next Gen by PCR, Nasal     Status: None   Collection Time: 01/17/22  5:30 PM   Specimen: Nasal Mucosa; Nasal Swab  Result Value Ref Range Status   MRSA by PCR Next Gen NOT DETECTED NOT DETECTED Final    Comment: (NOTE) The GeneXpert MRSA Assay (FDA  approved for NASAL specimens only), is one component of a comprehensive MRSA colonization surveillance program. It is not intended to diagnose MRSA infection nor to guide or monitor treatment for MRSA infections. Test performance is not FDA approved in patients less than 43 years old. Performed at [facility], [place] [address], [place], [place] [address]   Respiratory (~20 pathogens) panel by PCR     Status: None   Collection Time: 01/17/22  6:58 PM   Specimen: Tracheal Aspirate; Respiratory  Result Value Ref Range Status   Adenovirus NOT DETECTED NOT DETECTED Final   Coronavirus 229E NOT DETECTED NOT DETECTED Final    Comment: (NOTE) The Coronavirus  on the Respiratory Panel, DOES NOT test for the novel  Coronavirus (2019 nCoV)    Coronavirus HKU1 NOT DETECTED NOT DETECTED Final   Coronavirus NL63 NOT DETECTED NOT DETECTED Final   Coronavirus OC43 NOT DETECTED NOT DETECTED Final   Metapneumovirus NOT DETECTED NOT DETECTED Final   Rhinovirus / Enterovirus NOT DETECTED NOT DETECTED Final   Influenza A NOT DETECTED NOT DETECTED Final   Influenza B NOT DETECTED NOT DETECTED Final   Parainfluenza Virus 1 NOT DETECTED NOT DETECTED Final   Parainfluenza Virus 2 NOT DETECTED NOT DETECTED Final   Parainfluenza Virus 3 NOT DETECTED NOT DETECTED Final   Parainfluenza Virus 4 NOT DETECTED NOT DETECTED Final   Respiratory Syncytial Virus NOT DETECTED NOT DETECTED Final   Bordetella pertussis NOT DETECTED NOT DETECTED Final   Bordetella Parapertussis NOT DETECTED NOT DETECTED Final   Chlamydophila pneumoniae NOT DETECTED NOT DETECTED Final   Mycoplasma pneumoniae NOT DETECTED NOT DETECTED Final    Comment: Performed at [facility], [place] [address] [place], [place] [address]  Culture, BAL-quantitative w Gram Stain     Status: Abnormal   Collection Time: 01/18/22 11:37 AM   Specimen: Bronchoalveolar Lavage; Respiratory  Result Value Ref Range Status   Specimen  Description   Final    BRONCHIAL ALVEOLAR LAVAGE Performed at [facility] [address] [place], [place] [address]    Special Requests   Final    NONE Performed at [facility], [place] [address] [place], [place] [address]    Gram Stain   Final    NO SQUAMOUS EPITHELIAL CELLS SEEN MODERATE WBC SEEN NO ORGANISMS SEEN    Culture (A)  Final    20,000 COLONIES/mL Consistent with normal respiratory flora. Performed at [facility], [place] [address], [place], [place] [address]    Report Status 01/20/2022 FINAL  Final  Culture, fungus without smear     Status: None (Preliminary result)   Collection Time: 01/18/22 11:37 AM   Specimen: Bronchoalveolar Lavage; Other  Result Value Ref Range Status   Specimen Description   Final    BRONCHIAL ALVEOLAR LAVAGE Performed at [facility] [address], [place], [place] [address]    Special Requests   Final    NONE Performed at [facility], [place] [address] [place], [place] [address]    Culture   Final    NO FUNGUS ISOLATED AFTER 3 DAYS Performed at [facility], [place] [address] [place], [place] [address]    Report Status PENDING  Incomplete     Labs: BNP (last 3 results) No results for input(s): BNP in the last 8760 hours. Basic Metabolic Panel: Recent Labs  Lab 01/17/22 0524 01/18/22 0313  NA 138 140  K 4.0 3.7  CL 106 111  CO2 21* 22  GLUCOSE 146* 98  BUN 11 6  CREATININE 0.96 0.65  CALCIUM 8.9 8.8*   Liver Function Tests: Recent Labs  Lab 01/17/22 0524  AST 38  ALT 45*  ALKPHOS 79  BILITOT 1.3*  PROT 7.3  ALBUMIN 4.1   No results for input(s): LIPASE, AMYLASE in the last 168 hours. No results for input(s): AMMONIA in the last 168 hours. CBC: Recent Labs  Lab 01/17/22 0524 01/18/22 0313  WBC 10.9* 9.2  NEUTROABS 9.3*  --   HGB 13.5 12.3  HCT 40.2 36.9  MCV 89.7 88.5  PLT 258 220   Cardiac Enzymes: No results  for  input(s): CKTOTAL, CKMB, CKMBINDEX, TROPONINI in the last 168 hours. BNP: Invalid input(s): POCBNP CBG: No results for input(s): GLUCAP in the last 168 hours. D-Dimer No results for input(s): DDIMER in the last 72 hours. Hgb A1c No results for input(s): HGBA1C in the last 72 hours. Lipid Profile No results for input(s): CHOL, HDL, LDLCALC, TRIG, CHOLHDL, LDLDIRECT in the last 72 hours. Thyroid function studies No results for input(s): TSH, T4TOTAL, T3FREE, THYROIDAB in the last 72 hours.  Invalid input(s): FREET3 Anemia work up No results for input(s): VITAMINB12, FOLATE, FERRITIN, TIBC, IRON, RETICCTPCT in the last 72 hours. Urinalysis    Component Value Date/Time   COLORURINE YELLOW 01/17/2022 0628   APPEARANCEUR CLEAR 01/17/2022 0628   LABSPEC 1.005 01/17/2022 0628   PHURINE 6.0 01/17/2022 0628   GLUCOSEU NEGATIVE 01/17/2022 0628   HGBUR MODERATE (A) 01/17/2022 0628   BILIRUBINUR NEGATIVE 01/17/2022 0628   KETONESUR NEGATIVE 01/17/2022 0628   PROTEINUR NEGATIVE 01/17/2022 0628   NITRITE NEGATIVE 01/17/2022 0628   LEUKOCYTESUR TRACE (A) 01/17/2022 0628   Sepsis Labs Invalid input(s): PROCALCITONIN,  WBC,  LACTICIDVEN Microbiology Recent Results (from the past 240 hour(s))  Blood Culture (routine x 2)     Status: None (Preliminary result)   Collection Time: 01/17/22  5:25 AM   Specimen: BLOOD  Result Value Ref Range Status   Specimen Description   Final    BLOOD BLOOD LEFT FOREARM Performed at [facility], [place] [address], [place], [place] [address]    Special Requests   Final    BOTTLES DRAWN AEROBIC ONLY Blood Culture results may not be optimal due to an inadequate volume of blood received in culture bottles Performed at [facility] [address] [place], [place] [address]    Culture   Final    NO GROWTH 4 DAYS Performed at [facility], [place] [address] [place], [place] [address]    Report Status PENDING   Incomplete  Resp Panel by RT-PCR (Flu A&B, Covid)     Status: None   Collection Time: 01/17/22  5:27 AM   Specimen: Nasal Swab  Result Value Ref Range Status   SARS Coronavirus 2 by RT PCR NEGATIVE NEGATIVE Final    Comment: (NOTE) SARS-CoV-2 target nucleic acids are NOT DETECTED.  The SARS-CoV-2 RNA is generally detectable in upper respiratory specimens during the acute phase of infection. The lowest concentration of SARS-CoV-2 viral copies this assay can detect is 138 copies/mL. A negative result does not preclude SARS-Cov-2 infection and should not be used as the sole basis for treatment or other patient management decisions. A negative result may occur with  improper specimen collection/handling, submission of specimen other than nasopharyngeal swab, presence of viral mutation(s) within the areas targeted by this assay, and inadequate number of viral copies(<138 copies/mL). A negative result must be combined with clinical observations, patient history, and epidemiological information. The expected result is Negative.  Fact Sheet for Patients:  EntrepreneurPulse.com.au  Fact Sheet for Healthcare Providers:  IncredibleEmployment.be  This test is no t yet approved or cleared by the [place] FDA and  has been authorized for detection and/or diagnosis of SARS-CoV-2 by FDA under an Emergency Use Authorization (EUA). This EUA will remain  in effect (meaning this test can be used) for the duration of the COVID-19 declaration under Section 564(b)(1) of the Act, 21 U.S.C.section 360bbb-3(b)(1), unless the authorization is terminated  or revoked sooner.       Influenza A  by PCR NEGATIVE NEGATIVE Final   Influenza B by PCR NEGATIVE NEGATIVE Final    Comment: (NOTE) The Xpert Xpress SARS-CoV-2/FLU/RSV plus assay is intended as an aid in the diagnosis of influenza from Nasopharyngeal swab specimens and should not be used as a sole basis for  treatment. Nasal washings and aspirates are unacceptable for Xpert Xpress SARS-CoV-2/FLU/RSV testing.  Fact Sheet for Patients: EntrepreneurPulse.com.au  Fact Sheet for Healthcare Providers: IncredibleEmployment.be  This test is not yet approved or cleared by the [place] FDA and has been authorized for detection and/or diagnosis of SARS-CoV-2 by FDA under an Emergency Use Authorization (EUA). This EUA will remain in effect (meaning this test can be used) for the duration of the COVID-19 declaration under Section 564(b)(1) of the Act, 21 U.S.C. section 360bbb-3(b)(1), unless the authorization is terminated or revoked.  Performed at [facility], [place] [address], [place], [place] [address]   Blood Culture (routine x 2)     Status: None (Preliminary result)   Collection Time: 01/17/22  5:28 AM   Specimen: BLOOD  Result Value Ref Range Status   Specimen Description   Final    BLOOD RIGHT ANTECUBITAL Performed at [facility] [address] [place], [place] [address]    Special Requests   Final    BOTTLES DRAWN AEROBIC AND ANAEROBIC Blood Culture results may not be optimal due to an excessive volume of blood received in culture bottles Performed at [facility] [address], [place], [place] [address]    Culture   Final    NO GROWTH 4 DAYS Performed at [facility], [place] [address] [place], [place] [address]    Report Status PENDING  Incomplete  Urine Culture     Status: Abnormal   Collection Time: 01/17/22  6:28 AM   Specimen: In/Out Cath Urine  Result Value Ref Range Status   Specimen Description   Final    IN/OUT CATH URINE Performed at [facility] [address] [place], [place] [address]    Special Requests   Final    Normal Performed at [facility], [place] [address] [place], [place] [address]    Culture 70,000  COLONIES/mL ESCHERICHIA COLI (A)  Final   Report Status 01/19/2022 FINAL  Final   Organism ID, Bacteria ESCHERICHIA COLI (A)  Final      Susceptibility   Escherichia coli - MIC*    AMPICILLIN <=2 SENSITIVE Sensitive     CEFAZOLIN <=4 SENSITIVE Sensitive     CEFEPIME <=0.12 SENSITIVE Sensitive     CEFTRIAXONE <=0.25 SENSITIVE Sensitive     CIPROFLOXACIN <=0.25 SENSITIVE Sensitive     GENTAMICIN <=1 SENSITIVE Sensitive     IMIPENEM <=0.25 SENSITIVE Sensitive     NITROFURANTOIN <=16 SENSITIVE Sensitive     TRIMETH/SULFA <=20 SENSITIVE Sensitive     AMPICILLIN/SULBACTAM <=2 SENSITIVE Sensitive     PIP/TAZO <=4 SENSITIVE Sensitive     * 70,000 COLONIES/mL ESCHERICHIA COLI  MRSA Next Gen by PCR, Nasal     Status: None   Collection Time: 01/17/22  5:30 PM   Specimen: Nasal Mucosa; Nasal Swab  Result Value Ref Range Status   MRSA by PCR Next Gen NOT DETECTED NOT DETECTED Final    Comment: (NOTE) The GeneXpert MRSA Assay (FDA approved for NASAL specimens only), is one component of a comprehensive MRSA colonization surveillance program. It is not intended to diagnose MRSA infection nor to guide or monitor  treatment for MRSA infections. Test performance is not FDA approved in patients less than 1 years old. Performed at [facility] [facility], [place] [address] [place], [place] [address]   Respiratory (~20 pathogens) panel by PCR     Status: None   Collection Time: 01/17/22  6:58 PM   Specimen: Tracheal Aspirate; Respiratory  Result Value Ref Range Status   Adenovirus NOT DETECTED NOT DETECTED Final   Coronavirus 229E NOT DETECTED NOT DETECTED Final    Comment: (NOTE) The Coronavirus on the Respiratory Panel, DOES NOT test for the novel  Coronavirus (2019 nCoV)    Coronavirus HKU1 NOT DETECTED NOT DETECTED Final   Coronavirus NL63 NOT DETECTED NOT DETECTED Final   Coronavirus OC43 NOT DETECTED NOT DETECTED Final   Metapneumovirus NOT DETECTED NOT DETECTED Final   Rhinovirus  / Enterovirus NOT DETECTED NOT DETECTED Final   Influenza A NOT DETECTED NOT DETECTED Final   Influenza B NOT DETECTED NOT DETECTED Final   Parainfluenza Virus 1 NOT DETECTED NOT DETECTED Final   Parainfluenza Virus 2 NOT DETECTED NOT DETECTED Final   Parainfluenza Virus 3 NOT DETECTED NOT DETECTED Final   Parainfluenza Virus 4 NOT DETECTED NOT DETECTED Final   Respiratory Syncytial Virus NOT DETECTED NOT DETECTED Final   Bordetella pertussis NOT DETECTED NOT DETECTED Final   Bordetella Parapertussis NOT DETECTED NOT DETECTED Final   Chlamydophila pneumoniae NOT DETECTED NOT DETECTED Final   Mycoplasma pneumoniae NOT DETECTED NOT DETECTED Final    Comment: Performed at [facility], [place]. [address], [place], [place] [address]  Culture, BAL-quantitative w Gram Stain     Status: Abnormal   Collection Time: 01/18/22 11:37 AM   Specimen: Bronchoalveolar Lavage; Respiratory  Result Value Ref Range Status   Specimen Description   Final    BRONCHIAL ALVEOLAR LAVAGE Performed at [facility] [address] [place], [place] [address]    Special Requests   Final    NONE Performed at [facility], [place] [address], [place], [place] [address]    Gram Stain   Final    NO SQUAMOUS EPITHELIAL CELLS SEEN MODERATE WBC SEEN NO ORGANISMS SEEN    Culture (A)  Final    20,000 COLONIES/mL Consistent with normal respiratory flora. Performed at [facility], [place] [address], [place], [place] [address]    Report Status 01/20/2022 FINAL  Final  Culture, fungus without smear     Status: None (Preliminary result)   Collection Time: 01/18/22 11:37 AM   Specimen: Bronchoalveolar Lavage; Other  Result Value Ref Range Status   Specimen Description   Final    BRONCHIAL ALVEOLAR LAVAGE Performed at [facility] [address], [place], [place] [address]    Special Requests   Final    NONE Performed at [facility], [place] [address], [place], [place] [address]    Culture   Final    NO FUNGUS ISOLATED AFTER 3 DAYS Performed at [facility], [place] [address], [place], [place] [address]    Report Status PENDING  Incomplete     Time coordinating discharge: Over 30 minutes  SIGNED:   Charlynne Cousins, MD  [facility] 01/21/2022, 1:46 PM Pager   If 7PM-7AM, please contact night-coverage [email or web address] Password [email or web address]

## 2022-01-22 LAB — CULTURE, BLOOD (ROUTINE X 2)
Culture: NO GROWTH
Culture: NO GROWTH

## 2022-01-24 LAB — ASPERGILLUS ANTIGEN, BAL/SERUM: Aspergillus Ag, BAL/Serum: 0.06 Index (ref 0.00–0.49)

## 2022-01-25 ENCOUNTER — Telehealth: Payer: Self-pay

## 2022-01-25 DIAGNOSIS — A419 Sepsis, unspecified organism: Secondary | ICD-10-CM | POA: Diagnosis present

## 2022-01-25 NOTE — Telephone Encounter (Signed)
Spoke to patient and communicated message from Dr. Luciana Axe: "AFB smear results are negative meaning so far no signs of Tb.  therefore she does not need to keep isolated at home anymore necessarily and otherwise we are still waiting on results with no growth yet to date.  Can take up to 6 weeks".  Patient verbalized understanding and all questions answered. Scheduled for follow up appointment with Dr. Luciana Axe on 6/23.  Wyvonne Lenz, RN

## 2022-01-25 NOTE — Telephone Encounter (Signed)
-----   Message from Gardiner Barefoot, MD sent at 01/25/2022 10:19 AM EDT ----- Can you please let her know that her AFB smear results are negative meaning so far no signs of Tb.  therefore she does not need to keep isolated at home anymore necessarily and otherwise we are still waiting on results with no growth yet to date.  Can take up to 6 weeks.  See if she can follow up with someone in about 2 weeks or so. I know I have limited availability.  thanks

## 2022-02-08 ENCOUNTER — Inpatient Hospital Stay: Payer: Self-pay | Admitting: Internal Medicine

## 2022-02-08 LAB — CULTURE, FUNGUS WITHOUT SMEAR

## 2022-02-26 NOTE — Progress Notes (Unsigned)
Synopsis: Referred for bronchiectasis by No ref. provider found  Subjective:   PATIENT ID: Misty Misty Heath GENDER: female DOB: 09-09-1985, MRN: [id number]  No chief complaint on file.  36yF with history of TB treated 18 ya, obesity with recent admission for CAP treated with levaquin. Underwebt bronch/BAL 01/18/22 to evaluate for reactivation of TB.  Otherwise pertinent review of systems is negative.  Past Medical History:  Diagnosis Date   Class 1 obesity 01/17/2022   Tuberculosis    Diagnosed around 2011.  Treated for about a year.     No family history on file.   Past Surgical History:  Procedure Laterality Date   ANKLE FRACTURE SURGERY Right    BRONCHIAL WASHINGS  01/18/2022   Procedure: BRONCHIAL WASHINGS;  Surgeon: Maryjane Hurter, MD;  Location: [facility];  Service: Pulmonary;;   HIP ARTHROPLASTY Right    VIDEO BRONCHOSCOPY N/A 01/18/2022   Procedure: VIDEO BRONCHOSCOPY WITHOUT FLUORO;  Surgeon: Maryjane Hurter, MD;  Location: [facility];  Service: Pulmonary;  Laterality: N/A;    Social History   Socioeconomic History   Marital status: Married    Spouse name: Not on file   Number of children: Not on file   Years of education: Not on file   Highest education level: Not on file  Occupational History   Not on file  Tobacco Use   Smoking status: Never   Smokeless tobacco: Never  Substance and Sexual Activity   Alcohol use: Never   Drug use: Never   Sexual activity: Not on file  Other Topics Concern   Not on file  Social History Narrative   Not on file   Social Determinants of Health   Financial Resource Strain: Not on file  Food Insecurity: Not on file  Transportation Needs: Not on file  Physical Activity: Not on file  Stress: Not on file  Social Connections: Not on file  Intimate Partner Violence: Not on file     Allergies  Allergen Reactions   Ceclor [Cefaclor] Anaphylaxis and Shortness Of Breath     No outpatient medications prior to  visit.   No facility-administered medications prior to visit.       Objective:   Physical Exam:  General appearance: 36 y.o., female, NAD, conversant  Eyes: anicteric sclerae; PERRL, tracking appropriately HENT: NCAT; MMM Neck: Trachea midline; no lymphadenopathy, no JVD Lungs: CTAB, no crackles, no wheeze, with normal respiratory effort CV: RRR, no murmur  Abdomen: Soft, non-tender; non-distended, BS present  Extremities: No peripheral edema, warm Skin: Normal turgor and texture; no rash Psych: Appropriate affect Neuro: Alert and oriented to person and place, no focal deficit     There were no vitals filed for this visit.   on RA BMI Readings from Last 3 Encounters:  01/19/22 33.38 kg/m   Wt Readings from Last 3 Encounters:  01/19/22 194 lb 7.1 Misty [name]     CBC    Component Value Date/Time   WBC 9.2 01/18/2022 0313   RBC 4.17 01/18/2022 0313   HGB 12.3 01/18/2022 0313   HCT 36.9 01/18/2022 0313   PLT 220 01/18/2022 0313   MCV 88.5 01/18/2022 0313   MCH 29.5 01/18/2022 0313   MCHC 33.3 01/18/2022 0313   RDW 12.6 01/18/2022 0313   LYMPHSABS 1.2 01/17/2022 0524   MONOABS 0.3 01/17/2022 0524   EOSABS 0.0 01/17/2022 0524   BASOSABS 0.0 01/17/2022 0524   AFB culture pending  Chest Imaging: CT Chest 01/17/22 reviewed by me remarkable  for LUL predominant bronchiectasis, multiple calcified pulmonary nodules, splenomegaly, mosaic attenuation  Pulmonary Functions Testing Results:     No data to display             Assessment & Plan:    Plan:      Maryjane Hurter, MD [facility] 02/26/2022 5:36 PM

## 2022-02-27 ENCOUNTER — Encounter: Payer: Self-pay | Admitting: Student

## 2022-02-27 ENCOUNTER — Ambulatory Visit (INDEPENDENT_AMBULATORY_CARE_PROVIDER_SITE_OTHER): Payer: Self-pay | Admitting: Student

## 2022-02-27 VITALS — BP 124/78 | HR 71 | Temp 98.0°F | Ht 64.0 in | Wt 191.6 lb

## 2022-02-27 DIAGNOSIS — Z8611 Personal history of tuberculosis: Secondary | ICD-10-CM

## 2022-02-27 DIAGNOSIS — J479 Bronchiectasis, uncomplicated: Secondary | ICD-10-CM

## 2022-02-27 NOTE — Patient Instructions (Signed)
-  I'll get in touch once AFB culture results come back but I'll surprised if they come back positive -If worsening trouble breathing, cough that you can't shake, vision changes - particularly pain when looking in bright light, chest pain, another bout of pneumonia then call or send my chart message to make appointment with [place]!

## 2022-03-06 ENCOUNTER — Encounter: Payer: Self-pay | Admitting: Student

## 2022-03-06 LAB — ACID FAST CULTURE WITH REFLEXED SENSITIVITIES (MYCOBACTERIA): Acid Fast Culture: NEGATIVE

## 2022-10-23 ENCOUNTER — Ambulatory Visit
Admission: EM | Admit: 2022-10-23 | Discharge: 2022-10-23 | Disposition: A | Discharge status: Home / Self Care | Payer: BC Managed Care – PPO

## 2022-10-23 DIAGNOSIS — R52 Pain, unspecified: Secondary | ICD-10-CM | POA: Diagnosis not present

## 2022-10-23 DIAGNOSIS — J111 Influenza due to unidentified influenza virus with other respiratory manifestations: Secondary | ICD-10-CM

## 2022-10-23 DIAGNOSIS — B349 Viral infection, unspecified: Secondary | ICD-10-CM | POA: Diagnosis not present

## 2022-10-23 DIAGNOSIS — R509 Fever, unspecified: Secondary | ICD-10-CM

## 2022-10-23 MED ORDER — IBUPROFEN 600 MG PO TABS: 600.0000 mg | ORAL_TABLET | Freq: Four times a day (QID) | ORAL | 0 refills | Status: AC | PRN

## 2022-10-23 MED ORDER — ACETAMINOPHEN 325 MG PO TABS
650.0000 mg | ORAL_TABLET | Freq: Four times a day (QID) | ORAL | 0 refills | Status: AC | PRN
Start: 2022-10-23 — End: ?

## 2022-10-23 MED ORDER — IBUPROFEN 800 MG PO TABS
800.0000 mg | ORAL_TABLET | Freq: Once | ORAL | Status: AC
  Administered 2022-10-23: 800 mg via ORAL

## 2022-10-23 MED ORDER — OSELTAMIVIR PHOSPHATE 75 MG PO CAPS: 75.0000 mg | ORAL_CAPSULE | Freq: Two times a day (BID) | ORAL | 0 refills | Status: AC

## 2022-10-23 MED ORDER — PSEUDOEPHEDRINE HCL 30 MG PO TABS: 30.0000 mg | ORAL_TABLET | Freq: Three times a day (TID) | ORAL | 0 refills | Status: AC | PRN

## 2022-10-23 MED ORDER — ACETAMINOPHEN 325 MG PO TABS
650.0000 mg | ORAL_TABLET | Freq: Once | ORAL | Status: AC
  Administered 2022-10-23: 650 mg via ORAL

## 2022-10-23 MED ORDER — PSEUDOEPHEDRINE HCL 30 MG PO TABS: 30.0000 mg | ORAL_TABLET | Freq: Three times a day (TID) | ORAL | 0 refills | Status: DC | PRN

## 2022-10-23 MED ORDER — ACETAMINOPHEN 325 MG PO TABS: 650.0000 mg | ORAL_TABLET | Freq: Four times a day (QID) | ORAL | 0 refills | Status: DC | PRN

## 2022-10-23 MED ORDER — CETIRIZINE HCL 10 MG PO TABS: 10.0000 mg | ORAL_TABLET | Freq: Every day | ORAL | 0 refills | Status: DC

## 2022-10-23 MED ORDER — CETIRIZINE HCL 10 MG PO TABS
10.0000 mg | ORAL_TABLET | Freq: Every day | ORAL | 0 refills | Status: AC
Start: 2022-10-23 — End: ?

## 2022-10-23 MED ORDER — IBUPROFEN 600 MG PO TABS: 600.0000 mg | ORAL_TABLET | Freq: Four times a day (QID) | ORAL | 0 refills | Status: DC | PRN

## 2022-10-23 NOTE — ED Triage Notes (Addendum)
Pt c/o F and weakness 3-4 days 102.3,  Pt had V and D that started on Friday or Saturday and resolved 2 days ago. Pt husband reports that pt had  Dose of Tylenol about 4 hours ago '325mg'$  x 2. Pt has been rotating Motrin and Tylenol.

## 2022-10-23 NOTE — ED Provider Notes (Signed)
[facility]  Note:  This document was prepared using Systems analyst and may include unintentional dictation errors.  MRN: [id number] DOB: May 15, 1986  Subjective:   Misty Heath is a 37 y.o. female presenting for 4-day history of acute onset fever, weakness, malaise, fatigue.  Has had persistently elevated fevers of 102 and 103.  Her husband has been giving her Tylenol and alternating with Motrin.  Last dosing of Tylenol was 4 hours prior to arrival.  Did 650 mg.  Patient has also had some vomiting and diarrhea.  No chest pain, coughing, shortness of breath, wheezing.  No history of asthma.  No history of respiratory disorders.  No smoking, drug use.  No current facility-administered medications for this encounter.  Current Outpatient Medications:    amoxicillin (AMOXIL) 875 MG tablet, Take 875 mg by mouth 2 (two) times daily., Disp: , Rfl:    Allergies  Allergen Reactions   Ceclor [Cefaclor] Anaphylaxis and Shortness Of Breath    Past Medical History:  Diagnosis Date   Class 1 obesity 01/17/2022   Tuberculosis    Diagnosed around 2011.  Treated for about a year.     Past Surgical History:  Procedure Laterality Date   ANKLE FRACTURE SURGERY Right    BRONCHIAL WASHINGS  01/18/2022   Procedure: BRONCHIAL WASHINGS;  Surgeon: Maryjane Hurter, MD;  Location: [facility];  Service: Pulmonary;;   HIP ARTHROPLASTY Right    VIDEO BRONCHOSCOPY N/A 01/18/2022   Procedure: VIDEO BRONCHOSCOPY WITHOUT FLUORO;  Surgeon: Maryjane Hurter, MD;  Location: [facility];  Service: Pulmonary;  Laterality: N/A;    History reviewed. No pertinent family history.  Social History   Tobacco Use   Smoking status: Never   Smokeless tobacco: Never  Substance Use Topics   Alcohol use: Never   Drug use: Never    ROS   Objective:   Vitals: Pulse (!) 146   Temp (!) 103.1 F (39.5 C) (Oral)   LMP 10/02/2022 (Approximate)   SpO2 96%   Last temp  check was 100.50F at 19:46  Physical Exam Constitutional:      General: She is not in acute distress.    Appearance: Normal appearance. She is well-developed and normal weight. She is ill-appearing. She is not toxic-appearing or diaphoretic.  HENT:     Head: Normocephalic and atraumatic.     Right Ear: Tympanic membrane, ear canal and external ear normal. No drainage or tenderness. No middle ear effusion. There is no impacted cerumen. Tympanic membrane is not erythematous or bulging.     Left Ear: Tympanic membrane, ear canal and external ear normal. No drainage or tenderness.  No middle ear effusion. There is no impacted cerumen. Tympanic membrane is not erythematous or bulging.     Nose: Nose normal. No congestion or rhinorrhea.     Mouth/Throat:     Mouth: Mucous membranes are moist. No oral lesions.     Pharynx: No pharyngeal swelling, oropharyngeal exudate, posterior oropharyngeal erythema or uvula swelling.     Tonsils: No tonsillar exudate or tonsillar abscesses.  Eyes:     General: No scleral icterus.       Right eye: No discharge.        Left eye: No discharge.     Extraocular Movements: Extraocular movements intact.     Right eye: Normal extraocular motion.     Left eye: Normal extraocular motion.     Conjunctiva/sclera: Conjunctivae normal.  Cardiovascular:  Rate and Rhythm: Regular rhythm. Tachycardia present.     Heart sounds: Normal heart sounds. No murmur heard.    No friction rub. No gallop.  Pulmonary:     Effort: Pulmonary effort is normal. No respiratory distress.     Breath sounds: No stridor. No wheezing, rhonchi or rales.  Chest:     Chest wall: No tenderness.  Musculoskeletal:     Cervical back: Normal range of motion and neck supple.  Lymphadenopathy:     Cervical: No cervical adenopathy.  Skin:    General: Skin is warm and dry.  Neurological:     General: No focal deficit present.     Mental Status: She is alert and oriented to person, place, and  time.  Psychiatric:        Mood and Affect: Mood normal.        Behavior: Behavior normal.     Assessment and Plan :   PDMP not reviewed this encounter.  1. Acute viral syndrome   2. Body aches   3. Fever, unspecified   4. Influenza     High suspicion for influenza given her symptoms, current incidence in the community.  Recommended starting Tamiflu.  Offered imaging but patient declined.  Recommended supportive care otherwise.  Will defer steroid use for now. Counseled patient on potential for adverse effects with medications prescribed/recommended today, ER and return-to-clinic precautions discussed, patient verbalized understanding.    Jaynee Eagles, [place] 10/23/22 1947

## 2022-10-23 NOTE — Discharge Instructions (Addendum)
We will manage this as a viral illness. Push fluids, do 80 ounces daily. For sore throat or cough try using a honey-based tea. Use 3 teaspoons of honey with juice squeezed from half lemon. Place shaved pieces of ginger into 1/2-1 cup of water and warm over stove top. Then mix the ingredients and repeat every 4 hours as needed. Please take ibuprofen '600mg'$  every 6 hours with food alternating with OR taken together with Tylenol '650mg'$  every 6 hours for throat pain, fevers, aches and pains. Hydrate very well with at least 2 liters of water. Eat light meals such as soups (chicken and noodles, vegetable, chicken and wild rice).  Do not eat foods that you are allergic to.  Taking an antihistamine like Zyrtec ('10mg'$  daily) can help against postnasal drainage, sinus congestion which can cause sinus pain, sinus headaches, throat pain, painful swallowing, coughing.  You can take this together with pseudoephedrine (Sudafed) at a dose of '30mg'$  3 times a day or twice daily as needed for the same kind of nasal drip, congestion. If your pulse is not above 100 beats per minute you can use pseudoephedrine.

## 2022-10-25 ENCOUNTER — Emergency Department (HOSPITAL_BASED_OUTPATIENT_CLINIC_OR_DEPARTMENT_OTHER): Payer: BC Managed Care – PPO

## 2022-10-25 ENCOUNTER — Other Ambulatory Visit: Payer: Self-pay

## 2022-10-25 ENCOUNTER — Emergency Department (HOSPITAL_BASED_OUTPATIENT_CLINIC_OR_DEPARTMENT_OTHER)
Admission: EM | Admit: 2022-10-25 | Discharge: 2022-10-25 | Disposition: A | Discharge status: Home / Self Care | Payer: BC Managed Care – PPO | Attending: Emergency Medicine | Admitting: Emergency Medicine

## 2022-10-25 DIAGNOSIS — Z20822 Contact with and (suspected) exposure to covid-19: Secondary | ICD-10-CM | POA: Diagnosis not present

## 2022-10-25 DIAGNOSIS — N12 Tubulo-interstitial nephritis, not specified as acute or chronic: Secondary | ICD-10-CM | POA: Diagnosis not present

## 2022-10-25 DIAGNOSIS — R509 Fever, unspecified: Secondary | ICD-10-CM | POA: Diagnosis present

## 2022-10-25 LAB — URINALYSIS, ROUTINE W REFLEX MICROSCOPIC
Bilirubin Urine: NEGATIVE
Glucose, UA: NEGATIVE mg/dL
Ketones, ur: NEGATIVE mg/dL
Nitrite: NEGATIVE
Protein, ur: 100 mg/dL — AB
Specific Gravity, Urine: 1.02 (ref 1.005–1.030)
pH: 6 (ref 5.0–8.0)

## 2022-10-25 LAB — CBC WITH DIFFERENTIAL/PLATELET
Abs Immature Granulocytes: 0.04 10*3/uL (ref 0.00–0.07)
Basophils Absolute: 0 10*3/uL (ref 0.0–0.1)
Basophils Relative: 0 %
Eosinophils Absolute: 0 10*3/uL (ref 0.0–0.5)
Eosinophils Relative: 0 %
HCT: 37.8 % (ref 36.0–46.0)
Hemoglobin: 13 g/dL (ref 12.0–15.0)
Immature Granulocytes: 1 %
Lymphocytes Relative: 10 %
Lymphs Abs: 0.9 10*3/uL (ref 0.7–4.0)
MCH: 30 pg (ref 26.0–34.0)
MCHC: 34.4 g/dL (ref 30.0–36.0)
MCV: 87.1 fL (ref 80.0–100.0)
Monocytes Absolute: 1 10*3/uL (ref 0.1–1.0)
Monocytes Relative: 12 %
Neutro Abs: 6.5 10*3/uL (ref 1.7–7.7)
Neutrophils Relative %: 77 %
Platelets: 291 10*3/uL (ref 150–400)
RBC: 4.34 MIL/uL (ref 3.87–5.11)
RDW: 12.5 % (ref 11.5–15.5)
WBC: 8.5 10*3/uL (ref 4.0–10.5)
nRBC: 0 % (ref 0.0–0.2)

## 2022-10-25 LAB — RESP PANEL BY RT-PCR (RSV, FLU A&B, COVID)  RVPGX2
Influenza A by PCR: NEGATIVE
Influenza B by PCR: NEGATIVE
Resp Syncytial Virus by PCR: NEGATIVE
SARS Coronavirus 2 by RT PCR: NEGATIVE

## 2022-10-25 LAB — LACTIC ACID, PLASMA: Lactic Acid, Venous: 0.9 mmol/L (ref 0.5–1.9)

## 2022-10-25 LAB — BASIC METABOLIC PANEL
Anion gap: 6 (ref 5–15)
BUN: 6 mg/dL (ref 6–20)
CO2: 25 mmol/L (ref 22–32)
Calcium: 7.9 mg/dL — ABNORMAL LOW (ref 8.9–10.3)
Chloride: 100 mmol/L (ref 98–111)
Creatinine, Ser: 0.75 mg/dL (ref 0.44–1.00)
GFR, Estimated: >60 mL/min (ref >60)
Glucose, Bld: 106 mg/dL — ABNORMAL HIGH (ref 70–99)
Potassium: 3.2 mmol/L — ABNORMAL LOW (ref 3.5–5.1)
Sodium: 131 mmol/L — ABNORMAL LOW (ref 135–145)

## 2022-10-25 LAB — URINALYSIS, MICROSCOPIC (REFLEX)

## 2022-10-25 LAB — PREGNANCY, URINE: Preg Test, Ur: NEGATIVE

## 2022-10-25 MED ORDER — ONDANSETRON HCL 4 MG/2ML IJ SOLN
4.0000 mg | Freq: Once | INTRAMUSCULAR | Status: AC
Start: 2022-10-25 — End: 2022-10-25
  Administered 2022-10-25: 4 mg via INTRAVENOUS
  Filled 2022-10-25: qty 2

## 2022-10-25 MED ORDER — ACETAMINOPHEN 500 MG PO TABS: 1000.0000 mg | ORAL_TABLET | Freq: Once | ORAL | Status: AC

## 2022-10-25 MED ORDER — LEVOFLOXACIN IN D5W 750 MG/150ML IV SOLN
750.0000 mg | Freq: Once | INTRAVENOUS | Status: AC
Start: 2022-10-25 — End: 2022-10-25
  Administered 2022-10-25: 750 mg via INTRAVENOUS
  Filled 2022-10-25: qty 150

## 2022-10-25 MED ORDER — LACTATED RINGERS IV BOLUS
1000.0000 mL | Freq: Once | INTRAVENOUS | Status: AC
  Administered 2022-10-25: 1000 mL via INTRAVENOUS

## 2022-10-25 MED ORDER — LEVOFLOXACIN 750 MG PO TABS: 750.0000 mg | ORAL_TABLET | Freq: Every day | ORAL | 0 refills | Status: AC

## 2022-10-25 MED ORDER — ACETAMINOPHEN 500 MG PO TABS
ORAL_TABLET | ORAL | Status: AC
  Administered 2022-10-25: 1000 mg via ORAL
  Filled 2022-10-25: qty 2

## 2022-10-25 NOTE — ED Provider Notes (Signed)
[facility] Provider Note   CSN: [id number] Arrival date & time: 10/25/22  1832     History Chief Complaint  Patient presents with   Fever    HPI Misty Heath is a 37 y.o. female presenting for 5 days of fever and dysuria.  States she has had diffuse muscle aches.  Denies nausea vomiting syncope shortness of breath.  Did have some episodes of nausea 2 days ago but thought it was starting to improve.  Today temperature of 102 at home.  Frequent dysuria.   Patient's recorded medical, surgical, social, medication list and allergies were reviewed in the Snapshot window as part of the initial history.   Review of Systems   Review of Systems  Constitutional:  Positive for fever. Negative for chills.  HENT:  Negative for ear pain and sore throat.   Eyes:  Negative for pain and visual disturbance.  Respiratory:  Negative for cough and shortness of breath.   Cardiovascular:  Negative for chest pain and palpitations.  Gastrointestinal:  Negative for abdominal pain and vomiting.  Genitourinary:  Positive for dysuria. Negative for hematuria.  Musculoskeletal:  Negative for arthralgias and back pain.  Skin:  Negative for color change and rash.  Neurological:  Negative for seizures and syncope.  All other systems reviewed and are negative.   Physical Exam Updated Vital Signs BP 112/67   Pulse (!) 114   Temp 99.9 F (37.7 C) (Oral)   Resp (!) 23   Wt 86.6 kg   LMP 10/02/2022 (Approximate)   SpO2 97%   BMI 32.79 kg/m  Physical Exam Vitals and nursing note reviewed.  Constitutional:      General: She is not in acute distress.    Appearance: She is well-developed.  HENT:     Head: Normocephalic and atraumatic.  Eyes:     Conjunctiva/sclera: Conjunctivae normal.  Cardiovascular:     Rate and Rhythm: Normal rate and regular rhythm.     Heart sounds: No murmur heard. Pulmonary:     Effort: Pulmonary effort is normal. No respiratory  distress.     Breath sounds: Normal breath sounds.  Abdominal:     General: There is no distension.     Palpations: Abdomen is soft.     Tenderness: There is no abdominal tenderness. There is right CVA tenderness. There is no left CVA tenderness.  Musculoskeletal:        General: No swelling or tenderness. Normal range of motion.     Cervical back: Neck supple.  Skin:    General: Skin is warm and dry.  Neurological:     General: No focal deficit present.     Mental Status: She is alert and oriented to person, place, and time. Mental status is at baseline.     Cranial Nerves: No cranial nerve deficit.      ED Course/ Medical Decision Making/ A&P    Procedures Procedures   Medications Ordered in ED Medications  levofloxacin (LEVAQUIN) IVPB 750 mg (750 mg Intravenous New Bag/Given 10/25/22 2110)  acetaminophen (TYLENOL) tablet 1,000 mg (1,000 mg Oral Given 10/25/22 1910)  lactated ringers bolus 1,000 mL (0 mLs Intravenous Stopped 10/25/22 2213)  ondansetron (ZOFRAN) injection 4 mg (4 mg Intravenous Given 10/25/22 2106)    Medical Decision Making:    Rivers Kerscher is a 37 y.o. female who presented to the ED today with right-sided flank pain, fevers with Tmax of 102, nausea, vomiting, poor p.o. intake detailed above.  Patient's presentation is complicated by their history of multiple comorbid medical problems.  Patient placed on continuous vitals and telemetry monitoring while in ED which was reviewed periodically.   Complete initial physical exam performed, notably the patient  was hemodynamically stable no acute distress.  She has right-sided CVA tenderness on exam.      Reviewed and confirmed nursing documentation for past medical history, family history, social history.    Initial Assessment:   Patient's history of present on his physical exam findings are most consistent with pyelonephritis.  Considered alternative diagnoses such as nephrolithiasis, appendicitis,  cholecystitis.  Clinical illness seems more consistent with pyelonephritis based on duration of symptoms initiation of positive leukocytes and a home urine test 4 days ago and progression of disease off antibiotics.  Open nutrition agreed with this and prescribe patient Macrobid earlier today, however given progression of disease today, I think patient would warrant for more immediate treatment.  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 if improving. Reassessment: On reassessment, Patient has had gross symptomatic improvement.  She is stable for outpatient care management with p.o. Levaquin and follow-up with PCP for repeat urine clearance testing in 72 hours  Disposition:  I have considered need for hospitalization, however, considering all of the above, I believe this patient is stable for discharge at this time.  Patient/family educated about specific return precautions for given chief complaint and symptoms.  Patient/family educated about follow-up with PCP.     Patient/family expressed understanding of return precautions and need for follow-up. Patient spoken to regarding all imaging and laboratory results and appropriate follow up for these results. All education provided in verbal form with additional information in written form. Time was allowed for answering of patient questions. Patient discharged.    Emergency Department Medication Summary:   Medications  levofloxacin (LEVAQUIN) IVPB 750 mg (750 mg Intravenous New Bag/Given 10/25/22 2110)  acetaminophen (TYLENOL) tablet 1,000 mg (1,000 mg Oral Given 10/25/22 1910)  lactated ringers bolus 1,000 mL (0 mLs Intravenous Stopped 10/25/22 2213)  ondansetron (ZOFRAN) injection 4 mg (4 mg Intravenous Given 10/25/22 2106)        Clinical Impression:  1. Pyelonephritis      Discharge   Final Clinical Impression(s) / ED Diagnoses Final diagnoses:   Pyelonephritis    Rx / DC Orders ED Discharge Orders          Ordered    levofloxacin (LEVAQUIN) 750 MG tablet  Daily        10/25/22 2215              Tretha Sciara, MD 10/25/22 2215

## 2022-10-25 NOTE — ED Triage Notes (Signed)
Pt arrives with c/o fevers that have been going on since last Saturday. Pt endorse fatigue, generalized body aches, and chills. Pt did have n/v earlier in the week , but those symptoms have subsided.

## 2023-04-01 ENCOUNTER — Telehealth: Payer: Self-pay | Admitting: Adult Health

## 2023-04-01 NOTE — Telephone Encounter (Signed)
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 Please advise if patient needs to make an appointment or have any testing done. 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 Please call patient back with an update

## 2023-04-08 NOTE — Telephone Encounter (Signed)
Attempted to call pt, no answer. Could not leave VMM. Pt will need to have labs prior to note. Pt has not been seen but 1 time and with Dr.Meier.

## 2023-08-08 IMAGING — CT CT CHEST W/O CM
2 of 4 series · 15 of 36 positions shown, 18 images · non-contrast
Comparison: Chest radiograph done earlier today

CLINICAL DATA: Pneumonia, complication suspected



[Series 2: thorax · axial · 0.74mm/px · z∈[-305,-45]mm · 12 of 154 slices shown, 15 images]
[im 12/154  mediastinal]
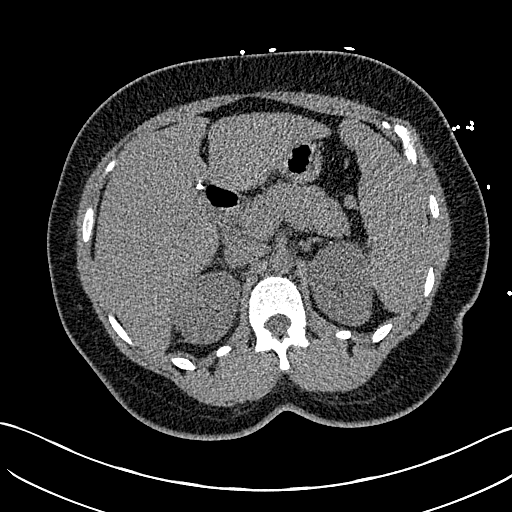
[im 12/154  lung]
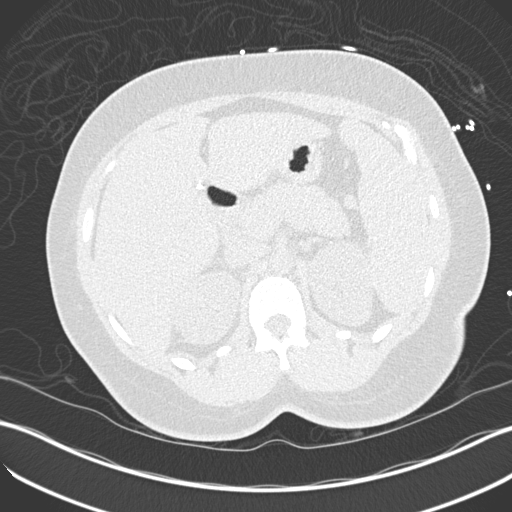
[im 24/154  lung]
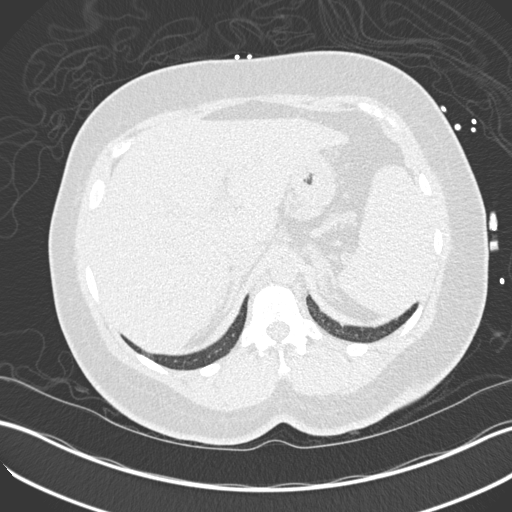
[im 36/154  lung]
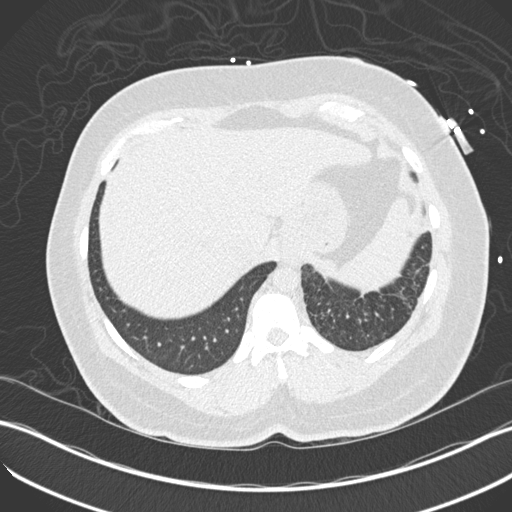
[im 48/154  lung]
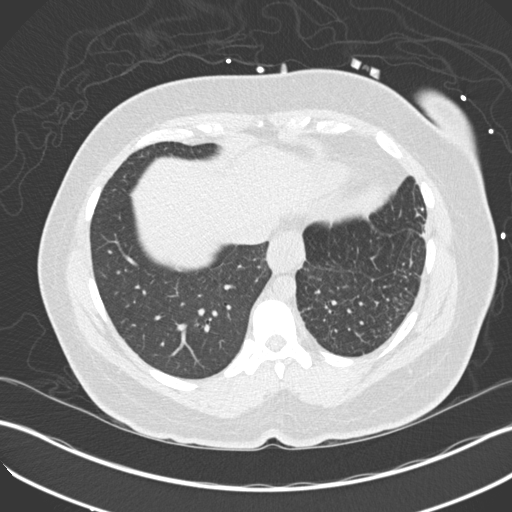
[im 59/154  mediastinal]
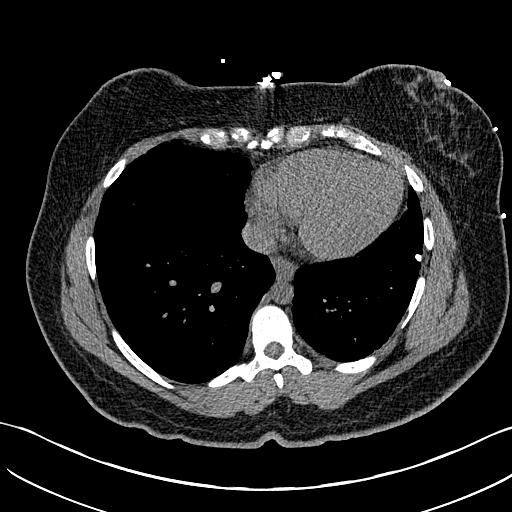
[im 59/154  lung]
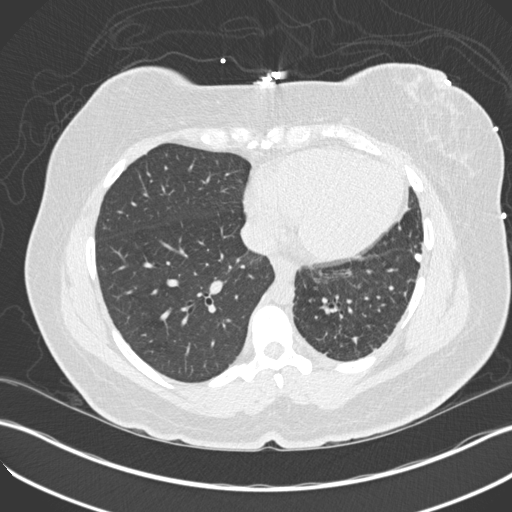
[im 71/154  lung]
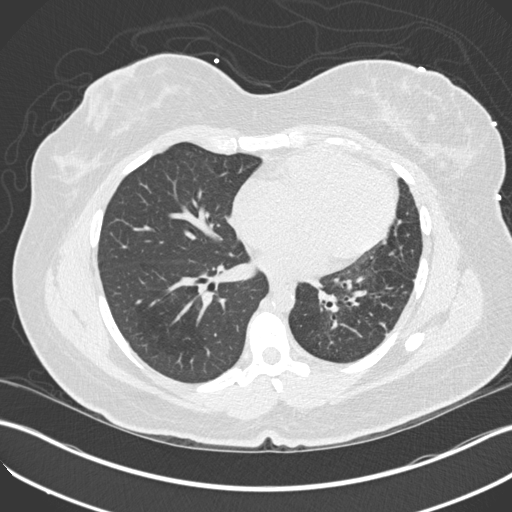
[im 83/154  lung]
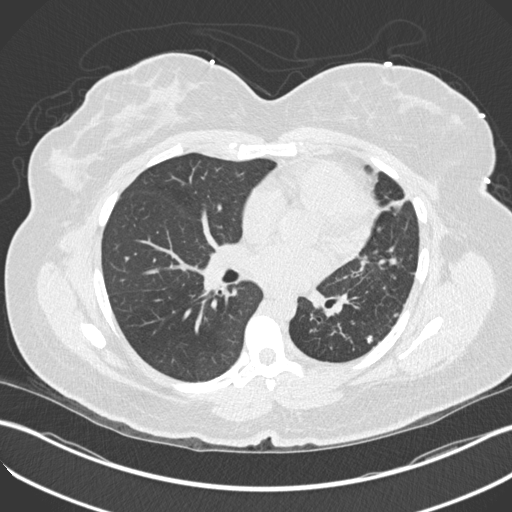
[im 95/154  lung]
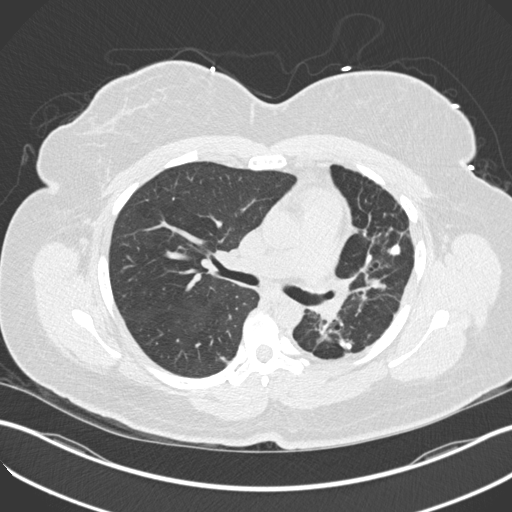
[im 106/154  mediastinal]
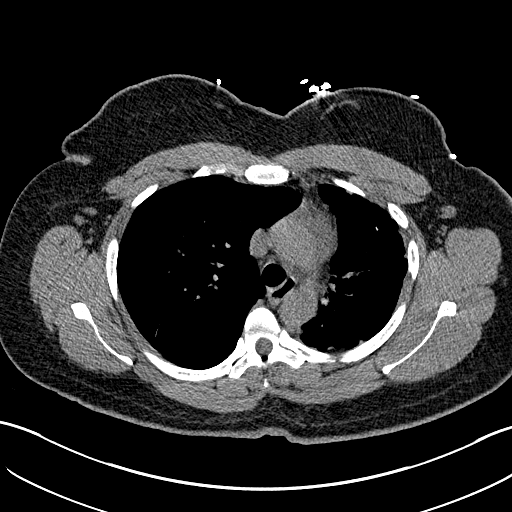
[im 106/154  lung]
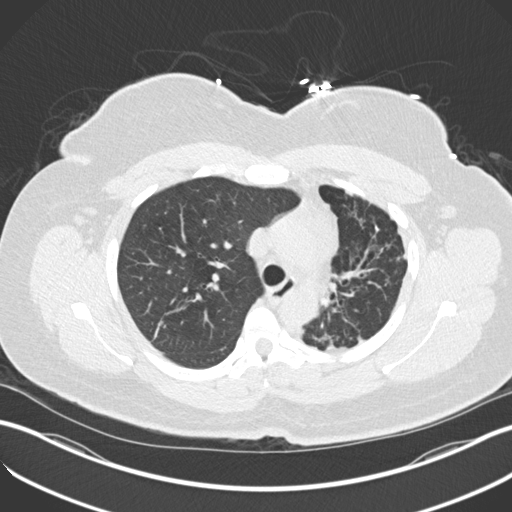
[im 118/154  lung]
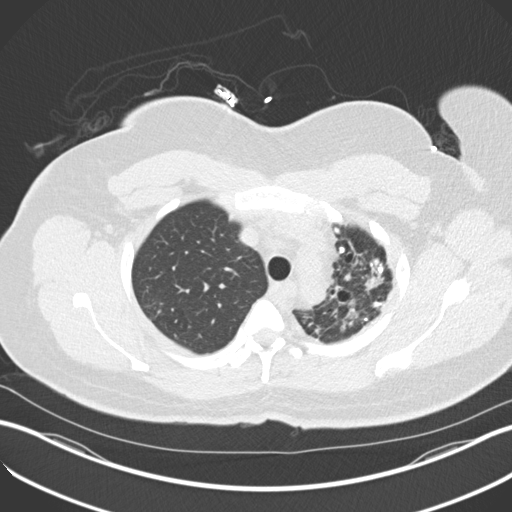
[im 130/154  lung]
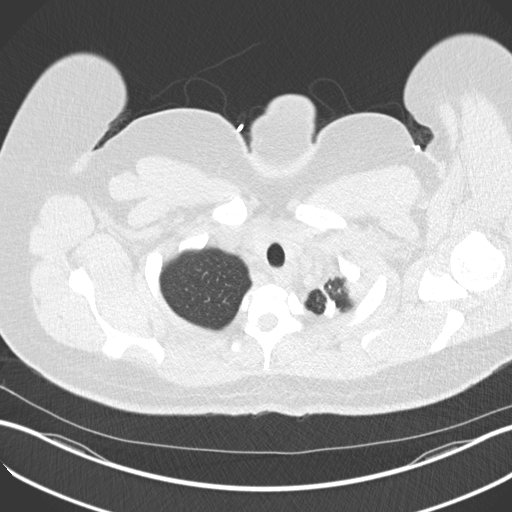
[im 142/154  lung]
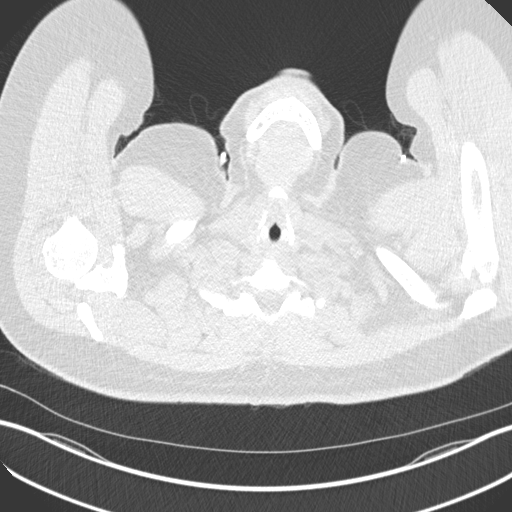

[Series 5: coronal · coronal · 0.63mm/px · 3 of 145 slices shown]
[im 29/145  lung]
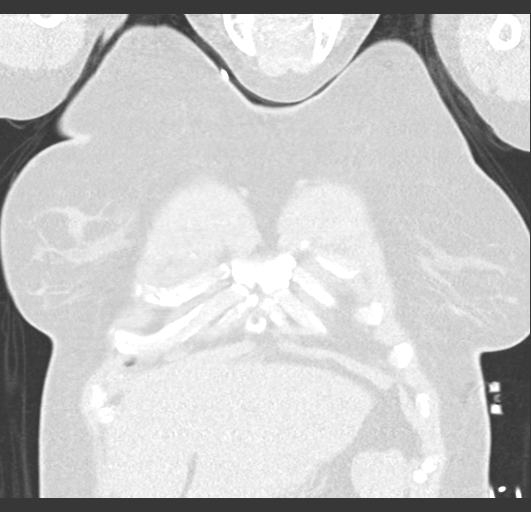
[im 58/145  lung]
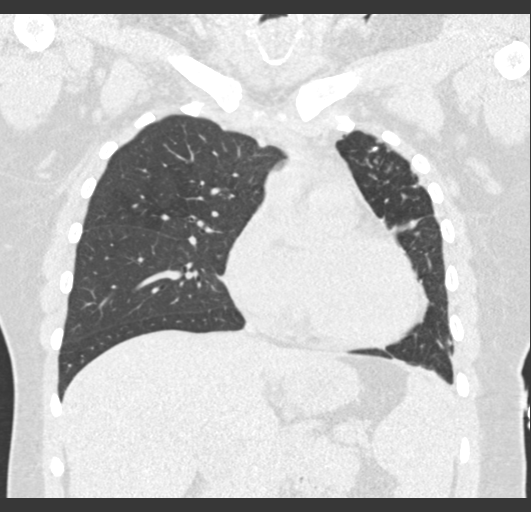
[im 87/145  lung]
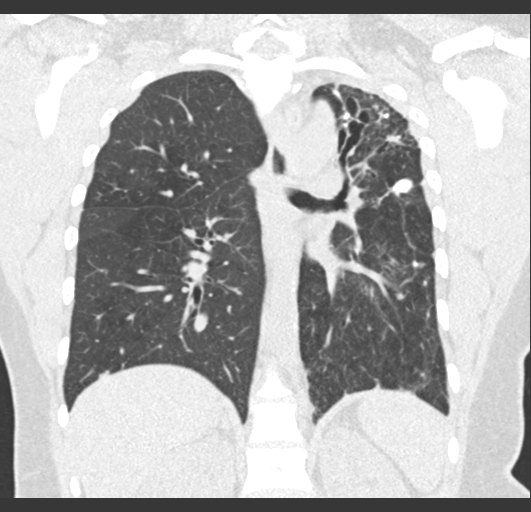

[15 of 36 positions shown; findings below may reference images not displayed]

FINDINGS: Cardiovascular: Unremarkable.

Mediastinum/Nodes: No significant lymphadenopathy seen.

Lungs/Pleura: There are numerous nodular densities of varying sizes
in the left upper lobe and left lower lobe. There are few small
nodular densities in the right upper lobe. Most of these nodules
show central calcification. There is ectasia of bronchi in the left
upper lobe. There is decreased volume in the left lung in comparison
to the right side. There is no pleural effusion or pneumothorax.

Upper Abdomen: Spleen measures 15.1 cm in AP diameter. Surgical
clips are seen in the gallbladder fossa. Small hiatal hernia is
seen.

Musculoskeletal: Unremarkable.
IMPRESSION: There are numerous nodules of varying sizes in the left upper lobe,
left lower lobe and right upper lobe. Most of these nodules show
central calcification. Left lung is smaller than right suggesting
scarring. Findings suggest chronic inflammation. Follow-up CT chest
in 3 months may be considered to assess stability.

There is no focal pulmonary consolidation. There is no pleural
effusion. There is ectasia of bronchi in the left upper lobe.

Enlarged spleen.  Small hiatal hernia.
